# Patient Record
Sex: Female | Born: 1950 | Race: White | Hispanic: No | Marital: Married | Smoking: Never smoker
Health system: Southern US, Community
[De-identification: ages and names within clinical notes are randomized; demographics above are authoritative.]

## PROBLEM LIST (undated history)

## (undated) DIAGNOSIS — M1990 Unspecified osteoarthritis, unspecified site: Secondary | ICD-10-CM

## (undated) DIAGNOSIS — C801 Malignant (primary) neoplasm, unspecified: Secondary | ICD-10-CM

## (undated) HISTORY — PX: COLONOSCOPY: SHX174

---

## 1998-09-01 ENCOUNTER — Other Ambulatory Visit: Admission: RE | Admit: 1998-09-01 | Discharge: 1998-09-01 | Payer: Self-pay | Admitting: *Deleted

## 1999-11-08 ENCOUNTER — Other Ambulatory Visit: Admission: RE | Admit: 1999-11-08 | Discharge: 1999-11-08 | Payer: Self-pay | Admitting: Obstetrics & Gynecology

## 2000-10-03 ENCOUNTER — Encounter: Payer: Self-pay | Admitting: Obstetrics and Gynecology

## 2000-10-03 ENCOUNTER — Encounter: Admission: RE | Admit: 2000-10-03 | Discharge: 2000-10-03 | Payer: Self-pay | Admitting: Obstetrics and Gynecology

## 2000-11-10 ENCOUNTER — Other Ambulatory Visit: Admission: RE | Admit: 2000-11-10 | Discharge: 2000-11-10 | Payer: Self-pay | Admitting: *Deleted

## 2001-11-09 ENCOUNTER — Encounter: Payer: Self-pay | Admitting: *Deleted

## 2001-11-09 ENCOUNTER — Encounter: Admission: RE | Admit: 2001-11-09 | Discharge: 2001-11-09 | Payer: Self-pay | Admitting: *Deleted

## 2002-08-01 ENCOUNTER — Other Ambulatory Visit: Admission: RE | Admit: 2002-08-01 | Discharge: 2002-08-01 | Payer: Self-pay | Admitting: Obstetrics and Gynecology

## 2004-01-08 ENCOUNTER — Encounter: Admission: RE | Admit: 2004-01-08 | Discharge: 2004-01-08 | Payer: Self-pay | Admitting: Obstetrics and Gynecology

## 2004-01-26 ENCOUNTER — Other Ambulatory Visit: Admission: RE | Admit: 2004-01-26 | Discharge: 2004-01-26 | Payer: Self-pay | Admitting: Obstetrics and Gynecology

## 2005-02-24 ENCOUNTER — Ambulatory Visit: Payer: Self-pay | Admitting: Family Medicine

## 2005-07-07 ENCOUNTER — Other Ambulatory Visit: Admission: RE | Admit: 2005-07-07 | Discharge: 2005-07-07 | Payer: Self-pay | Admitting: Obstetrics and Gynecology

## 2013-03-07 ENCOUNTER — Other Ambulatory Visit: Payer: Self-pay | Admitting: Obstetrics and Gynecology

## 2013-03-07 DIAGNOSIS — R928 Other abnormal and inconclusive findings on diagnostic imaging of breast: Secondary | ICD-10-CM

## 2013-03-12 ENCOUNTER — Ambulatory Visit
Admission: RE | Admit: 2013-03-12 | Discharge: 2013-03-12 | Disposition: A | Discharge status: Home / Self Care | Payer: BC Managed Care – PPO | Source: Ambulatory Visit | Attending: Obstetrics and Gynecology | Admitting: Obstetrics and Gynecology

## 2013-03-12 DIAGNOSIS — R928 Other abnormal and inconclusive findings on diagnostic imaging of breast: Secondary | ICD-10-CM

## 2014-02-03 ENCOUNTER — Other Ambulatory Visit: Payer: Self-pay | Admitting: Obstetrics and Gynecology

## 2014-02-03 DIAGNOSIS — R921 Mammographic calcification found on diagnostic imaging of breast: Secondary | ICD-10-CM

## 2014-03-28 ENCOUNTER — Ambulatory Visit
Admission: RE | Admit: 2014-03-28 | Discharge: 2014-03-28 | Disposition: A | Discharge status: Home / Self Care | Payer: BC Managed Care – PPO | Source: Ambulatory Visit | Attending: Obstetrics and Gynecology | Admitting: Obstetrics and Gynecology

## 2014-03-28 DIAGNOSIS — R921 Mammographic calcification found on diagnostic imaging of breast: Secondary | ICD-10-CM

## 2015-10-09 ENCOUNTER — Other Ambulatory Visit: Payer: Self-pay | Admitting: Obstetrics and Gynecology

## 2015-10-09 DIAGNOSIS — R921 Mammographic calcification found on diagnostic imaging of breast: Secondary | ICD-10-CM

## 2015-10-19 ENCOUNTER — Ambulatory Visit
Admission: RE | Admit: 2015-10-19 | Discharge: 2015-10-19 | Disposition: A | Discharge status: Home / Self Care | Payer: Commercial Managed Care - HMO | Source: Ambulatory Visit | Attending: Obstetrics and Gynecology | Admitting: Obstetrics and Gynecology

## 2015-10-19 DIAGNOSIS — R921 Mammographic calcification found on diagnostic imaging of breast: Secondary | ICD-10-CM

## 2017-05-22 ENCOUNTER — Other Ambulatory Visit: Payer: Self-pay | Admitting: Obstetrics and Gynecology

## 2017-05-22 DIAGNOSIS — Z1231 Encounter for screening mammogram for malignant neoplasm of breast: Secondary | ICD-10-CM

## 2017-06-20 ENCOUNTER — Ambulatory Visit
Admission: RE | Admit: 2017-06-20 | Discharge: 2017-06-20 | Disposition: A | Discharge status: Home / Self Care | Payer: BLUE CROSS/BLUE SHIELD | Source: Ambulatory Visit | Attending: Obstetrics and Gynecology | Admitting: Obstetrics and Gynecology

## 2017-06-20 DIAGNOSIS — Z1231 Encounter for screening mammogram for malignant neoplasm of breast: Secondary | ICD-10-CM

## 2018-06-11 ENCOUNTER — Other Ambulatory Visit: Payer: Self-pay | Admitting: Obstetrics and Gynecology

## 2018-06-11 DIAGNOSIS — Z1231 Encounter for screening mammogram for malignant neoplasm of breast: Secondary | ICD-10-CM

## 2018-07-16 ENCOUNTER — Ambulatory Visit
Admission: RE | Admit: 2018-07-16 | Discharge: 2018-07-16 | Disposition: A | Discharge status: Home / Self Care | Payer: BLUE CROSS/BLUE SHIELD | Source: Ambulatory Visit | Attending: Obstetrics and Gynecology | Admitting: Obstetrics and Gynecology

## 2018-07-16 DIAGNOSIS — Z1231 Encounter for screening mammogram for malignant neoplasm of breast: Secondary | ICD-10-CM

## 2019-06-12 ENCOUNTER — Other Ambulatory Visit: Payer: Self-pay | Admitting: Obstetrics and Gynecology

## 2019-06-12 DIAGNOSIS — Z1231 Encounter for screening mammogram for malignant neoplasm of breast: Secondary | ICD-10-CM

## 2019-07-25 ENCOUNTER — Ambulatory Visit
Admission: RE | Admit: 2019-07-25 | Discharge: 2019-07-25 | Disposition: A | Discharge status: Home / Self Care | Payer: Medicare Other | Source: Ambulatory Visit | Attending: Obstetrics and Gynecology | Admitting: Obstetrics and Gynecology

## 2019-07-25 ENCOUNTER — Other Ambulatory Visit: Payer: Self-pay

## 2019-07-25 DIAGNOSIS — Z1231 Encounter for screening mammogram for malignant neoplasm of breast: Secondary | ICD-10-CM

## 2020-07-06 ENCOUNTER — Other Ambulatory Visit: Payer: Self-pay | Admitting: Obstetrics and Gynecology

## 2020-07-06 DIAGNOSIS — Z1231 Encounter for screening mammogram for malignant neoplasm of breast: Secondary | ICD-10-CM

## 2020-07-27 ENCOUNTER — Other Ambulatory Visit: Payer: Self-pay

## 2020-07-27 ENCOUNTER — Ambulatory Visit
Admission: RE | Admit: 2020-07-27 | Discharge: 2020-07-27 | Disposition: A | Discharge status: Home / Self Care | Payer: Medicare Other | Source: Ambulatory Visit | Attending: Obstetrics and Gynecology | Admitting: Obstetrics and Gynecology

## 2020-07-27 DIAGNOSIS — Z1231 Encounter for screening mammogram for malignant neoplasm of breast: Secondary | ICD-10-CM

## 2021-06-13 IMAGING — MG DIGITAL SCREENING BILAT W/ TOMO W/ CAD
8 series · 8 of 24 positions shown · non-contrast
Comparison: Previous exam(s).

CLINICAL DATA: Screening.

EXAM:
DIGITAL SCREENING BILATERAL MAMMOGRAM WITH TOMO AND CAD

[R MLO synth-2D]
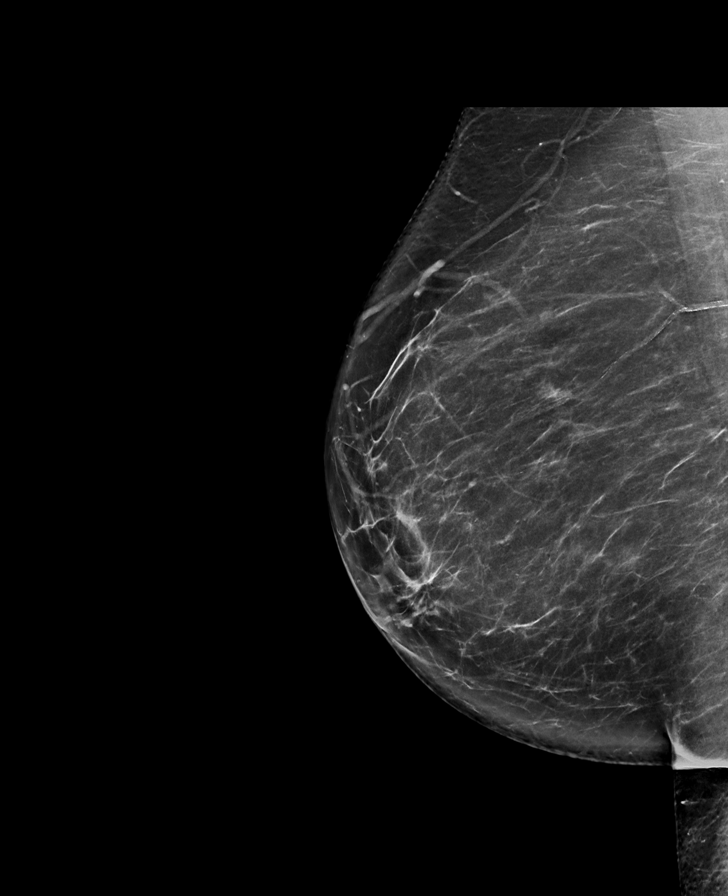

[R CC synth-2D]
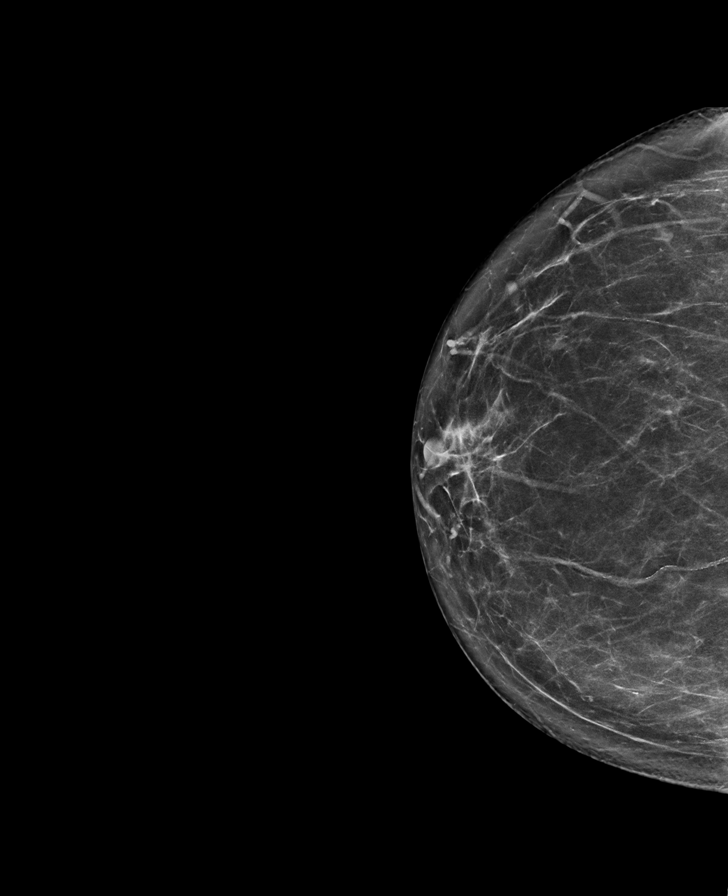

[L MLO synth-2D]
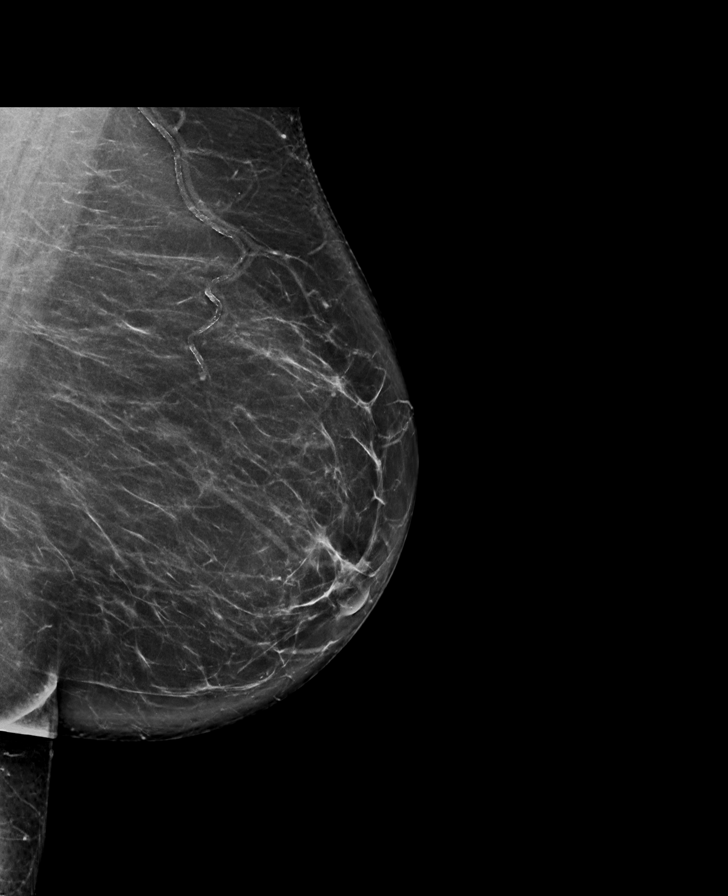

[L CC synth-2D]
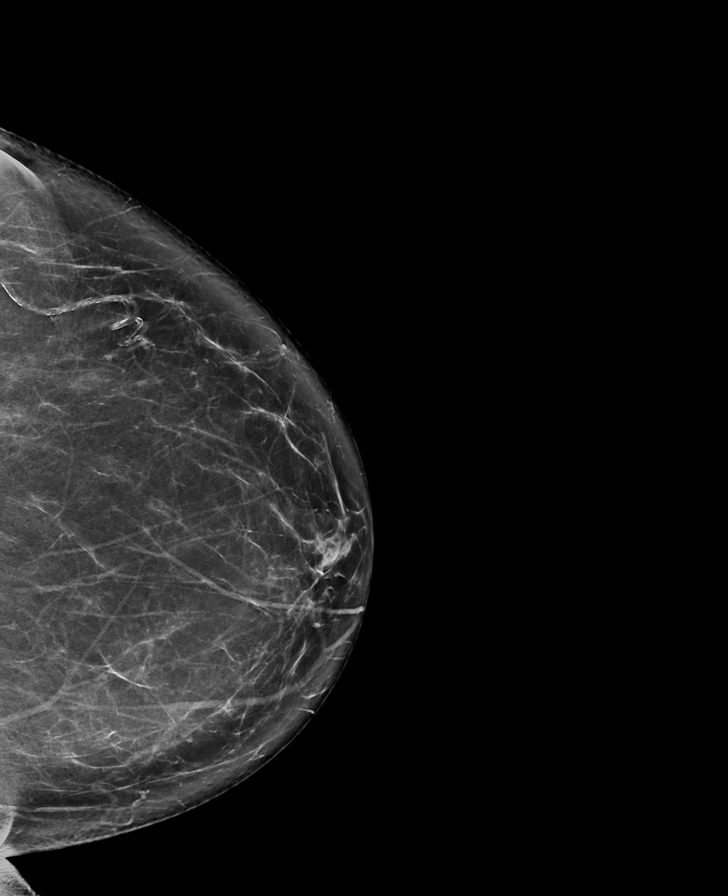

[R CC tomo · tomo slice 39/77.0]
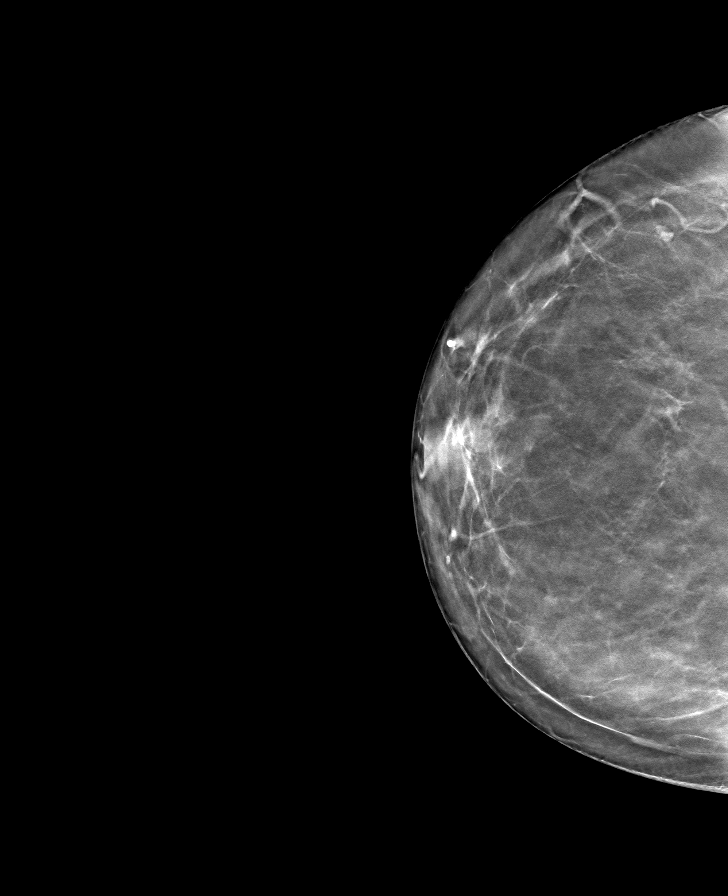

[L MLO tomo · tomo slice 43/86.0]
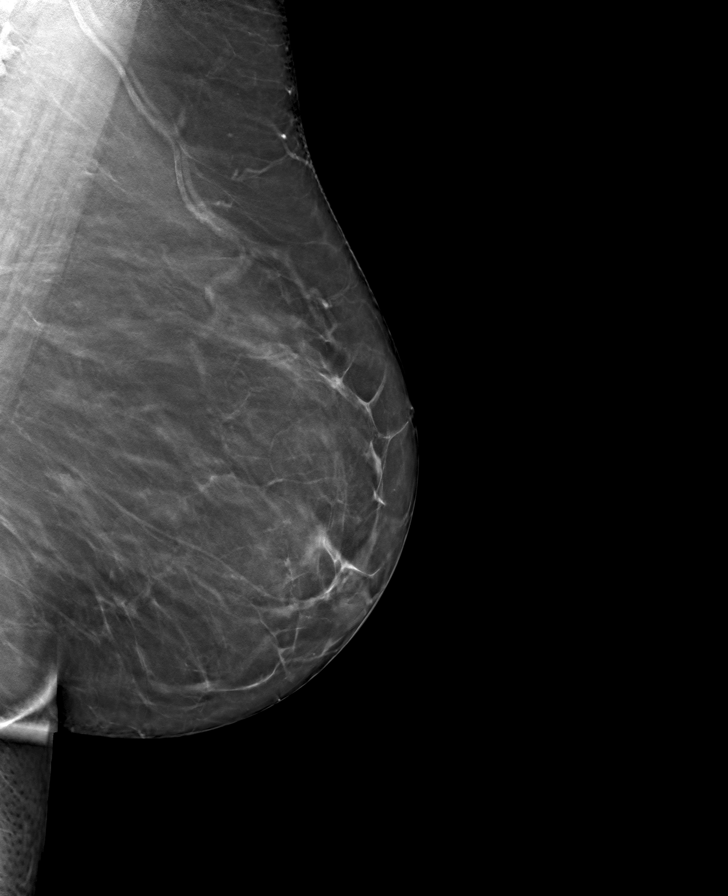

[R MLO tomo · tomo slice 43/86.0]
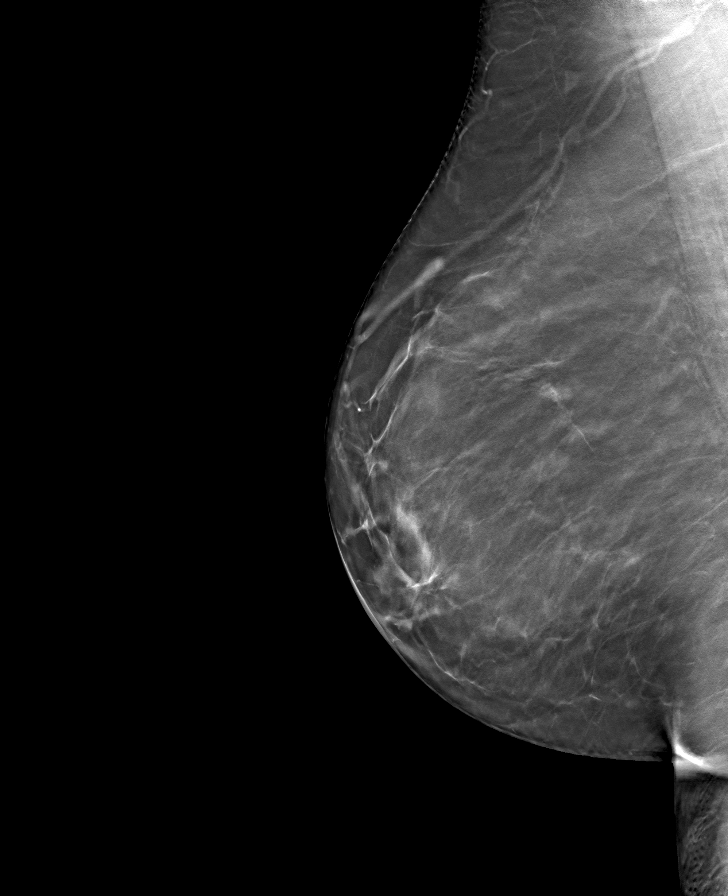

[L CC tomo · tomo slice 43/84.0]
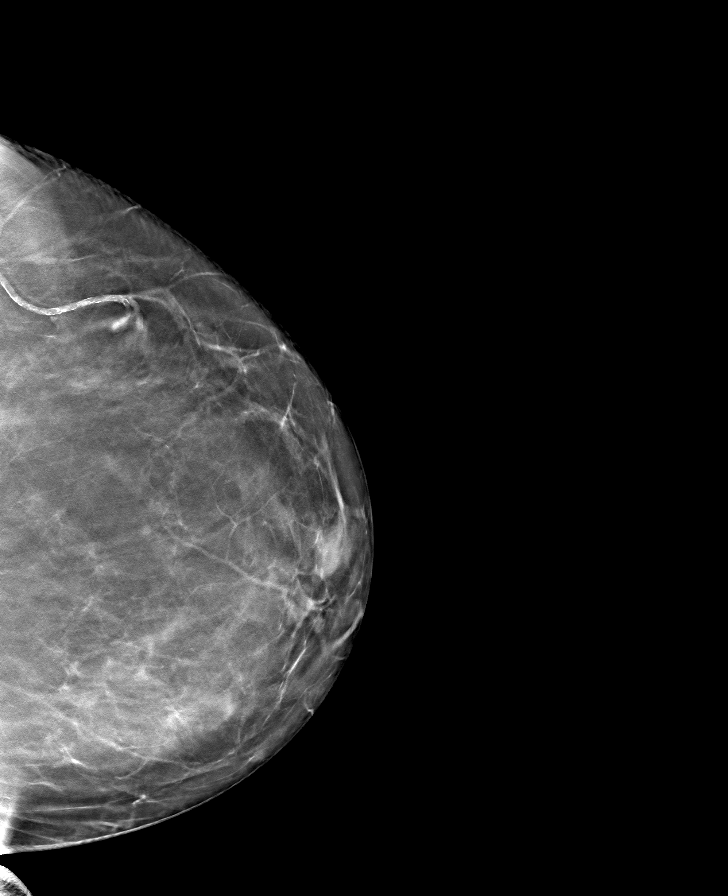

[8 of 24 positions shown; findings below may reference images not displayed]

ACR Breast Density Category b: There are scattered areas of
fibroglandular density.
FINDINGS: There are no findings suspicious for malignancy. Images were
processed with CAD.
IMPRESSION: No mammographic evidence of malignancy. A result letter of this
screening mammogram will be mailed directly to the patient.

RECOMMENDATION:
Screening mammogram in one year. (Code:[id number])

BI-RADS CATEGORY  1: Negative.

## 2021-06-22 ENCOUNTER — Other Ambulatory Visit: Payer: Self-pay | Admitting: Obstetrics and Gynecology

## 2021-06-22 DIAGNOSIS — Z1231 Encounter for screening mammogram for malignant neoplasm of breast: Secondary | ICD-10-CM

## 2021-08-19 ENCOUNTER — Ambulatory Visit
Admission: RE | Admit: 2021-08-19 | Discharge: 2021-08-19 | Disposition: A | Discharge status: Home / Self Care | Payer: Medicare Other | Source: Ambulatory Visit | Attending: Obstetrics and Gynecology | Admitting: Obstetrics and Gynecology

## 2021-08-19 ENCOUNTER — Other Ambulatory Visit: Payer: Self-pay

## 2021-08-19 DIAGNOSIS — Z1231 Encounter for screening mammogram for malignant neoplasm of breast: Secondary | ICD-10-CM

## 2022-02-02 NOTE — Patient Instructions (Signed)
DUE TO COVID-19 ONLY ONE VISITOR  (aged 69 and older)  IS ALLOWED TO COME WITH YOU AND STAY IN THE WAITING ROOM ONLY DURING PRE OP AND PROCEDURE.   **NO VISITORS ARE ALLOWED IN THE SHORT STAY AREA OR RECOVERY ROOM!!**  IF YOU WILL BE ADMITTED INTO THE HOSPITAL YOU ARE ALLOWED ONLY TWO SUPPORT PEOPLE DURING VISITATION HOURS ONLY (7 AM -8PM)   The support person(s) must pass our screening, gel in and out, and wear a mask at all times, including in the patients room. Patients must also wear a mask when staff or their support person are in the room. Visitors GUEST BADGE MUST BE WORN VISIBLY  One adult visitor may remain with you overnight and MUST be in the room by 8 P.M.        Your procedure is scheduled on: 02/15/22   Report to [facility] Main Entrance    Report to admitting at : 7:15 AM   Call this number if you have problems the morning of surgery [phone]   Do not eat food :After Midnight.   After Midnight you may have the following liquids until: 7:00 AM DAY OF SURGERY  Water Black Coffee (sugar ok, NO MILK/CREAM OR CREAMERS)  Tea (sugar ok, NO MILK/CREAM OR CREAMERS) regular and decaf                             Plain Jell-O (NO RED)                                           Fruit ices (not with fruit pulp, NO RED)                                     Popsicles (NO RED)                                                                  Juice: apple, WHITE grape, WHITE cranberry Sports drinks like Gatorade (NO RED) Clear broth(vegetable,chicken,beef)   Drink Ensure drink AT : 7:00 AM the day of surgery.   The day of surgery:  Drink ONE (1) Pre-Surgery Clear Ensure or G2 at AM the morning of surgery. Drink in one sitting. Do not sip.  This drink was given to you during your hospital  pre-op appointment visit. Nothing else to drink after completing the  Pre-Surgery Clear Ensure or G2.          If you have questions, please contact your surgeons office.    Oral  Hygiene is also important to reduce your risk of infection.                                    Remember - BRUSH YOUR TEETH THE MORNING OF SURGERY WITH YOUR REGULAR TOOTHPASTE   Do NOT smoke after Midnight   Take these medicines the morning of surgery with A SIP OF WATER: N/A  DO NOT TAKE ANY  ORAL DIABETIC MEDICATIONS DAY OF YOUR SURGERY  Bring CPAP mask and tubing day of surgery.                              You may not have any metal on your body including hair pins, jewelry, and body piercing             Do not wear make-up, lotions, powders, perfumes/cologne, or deodorant  Do not wear nail polish including gel and S&S, artificial/acrylic nails, or any other type of covering on natural nails including finger and toenails. If you have artificial nails, gel coating, etc. that needs to be removed by a nail salon please have this removed prior to surgery or surgery may need to be canceled/ delayed if the surgeon/ anesthesia feels like they are unable to be safely monitored.   Do not shave  48 hours prior to surgery.    Do not bring valuables to the hospital. [place].   Contacts, dentures or bridgework may not be worn into surgery.   Bring small overnight bag day of surgery.    Patients discharged on the day of surgery will not be allowed to drive home.  Someone NEEDS to stay with you for the first 24 hours after anesthesia.   Special Instructions: Bring a copy of your healthcare power of attorney and living will documents         the day of surgery if you haven't scanned them before.              Please read over the following fact sheets you were given: IF YOU HAVE QUESTIONS ABOUT YOUR PRE-OP INSTRUCTIONS PLEASE CALL [phone]     [facility] - Preparing for Surgery Before surgery, you can play an important role.  Because skin is not sterile, your skin needs to be as free of germs as possible.  You can reduce the number of germs on  your skin by washing with CHG (chlorahexidine gluconate) soap before surgery.  CHG is an antiseptic cleaner which kills germs and bonds with the skin to continue killing germs even after washing. Please DO NOT use if you have an allergy to CHG or antibacterial soaps.  If your skin becomes reddened/irritated stop using the CHG and inform your nurse when you arrive at Short Stay. Do not shave (including legs and underarms) for at least 48 hours prior to the first CHG shower.  You may shave your face/neck. Please follow these instructions carefully:  1.  Shower with CHG Soap the night before surgery and the  morning of Surgery.  2.  If you choose to wash your hair, wash your hair first as usual with your  normal  shampoo.  3.  After you shampoo, rinse your hair and body thoroughly to remove the  shampoo.                           4.  Use CHG as you would any other liquid soap.  You can apply chg directly  to the skin and wash                       Gently with a scrungie or clean washcloth.  5.  Apply the CHG Soap to your body ONLY  FROM THE NECK DOWN.   Do not use on face/ open                           Wound or open sores. Avoid contact with eyes, ears mouth and genitals (private parts).                       Wash face,  Genitals (private parts) with your normal soap.             6.  Wash thoroughly, paying special attention to the area where your surgery  will be performed.  7.  Thoroughly rinse your body with warm water from the neck down.  8.  DO NOT shower/wash with your normal soap after using and rinsing off  the CHG Soap.                9.  Pat yourself dry with a clean towel.            10.  Wear clean pajamas.            11.  Place clean sheets on your bed the night of your first shower and do not  sleep with pets. Day of Surgery : Do not apply any lotions/deodorants the morning of surgery.  Please wear clean clothes to the hospital/surgery center.  FAILURE TO FOLLOW THESE INSTRUCTIONS MAY  RESULT IN THE CANCELLATION OF YOUR SURGERY PATIENT SIGNATURE_________________________________  NURSE SIGNATURE__________________________________  ________________________________________________________________________   Kristina Vang  An incentive spirometer is a tool that can help keep your lungs clear and active. This tool measures how well you are filling your lungs with each breath. Taking long deep breaths may help reverse or decrease the chance of developing breathing (pulmonary) problems (especially infection) following: A long period of time when you are unable to move or be active. BEFORE THE PROCEDURE  If the spirometer includes an indicator to show your best effort, your nurse or respiratory therapist will set it to a desired goal. If possible, sit up straight or lean slightly forward. Try not to slouch. Hold the incentive spirometer in an upright position. INSTRUCTIONS FOR USE  Sit on the edge of your bed if possible, or sit up as far as you can in bed or on a chair. Hold the incentive spirometer in an upright position. Breathe out normally. Place the mouthpiece in your mouth and seal your lips tightly around it. Breathe in slowly and as deeply as possible, raising the piston or the ball toward the top of the column. Hold your breath for 3-5 seconds or for as long as possible. Allow the piston or ball to fall to the bottom of the column. Remove the mouthpiece from your mouth and breathe out normally. Rest for a few seconds and repeat Steps 1 through 7 at least 10 times every 1-2 hours when you are awake. Take your time and take a few normal breaths between deep breaths. The spirometer may include an indicator to show your best effort. Use the indicator as a goal to work toward during each repetition. After each set of 10 deep breaths, practice coughing to be sure your lungs are clear. If you have an incision (the cut made at the time of surgery), support your incision  when coughing by placing a pillow or rolled up towels firmly against it. Once you are able to get out of bed, walk around indoors and cough well.  You may stop using the incentive spirometer when instructed by your caregiver.  RISKS AND COMPLICATIONS Take your time so you do not get dizzy or light-headed. If you are in pain, you may need to take or ask for pain medication before doing incentive spirometry. It is harder to take a deep breath if you are having pain. AFTER USE Rest and breathe slowly and easily. It can be helpful to keep track of a log of your progress. Your caregiver can provide you with a simple table to help with this. If you are using the spirometer at home, follow these instructions: [place] IF:  You are having difficultly using the spirometer. You have trouble using the spirometer as often as instructed. Your pain medication is not giving enough relief while using the spirometer. You develop fever of 100.5 F (38.1 C) or higher. SEEK IMMEDIATE MEDICAL CARE IF:  You cough up bloody sputum that had not been present before. You develop fever of 102 F (38.9 C) or greater. You develop worsening pain at or near the incision site. MAKE SURE YOU:  Understand these instructions. Will watch your condition. Will get help right away if you are not doing well or get worse. Document Released: 03/27/2007 Document Revised: 02/06/2012 Document Reviewed: 05/28/2007 [facility] Patient Information 2014 [place], [place].   ________________________________________________________________________

## 2022-02-03 ENCOUNTER — Other Ambulatory Visit: Payer: Self-pay

## 2022-02-03 ENCOUNTER — Encounter (INDEPENDENT_AMBULATORY_CARE_PROVIDER_SITE_OTHER): Payer: Self-pay

## 2022-02-03 ENCOUNTER — Encounter (HOSPITAL_COMMUNITY)
Admission: RE | Admit: 2022-02-03 | Discharge: 2022-02-03 | Disposition: A | Discharge status: Home / Self Care | Payer: Medicare Other | Source: Ambulatory Visit | Attending: Orthopedic Surgery | Admitting: Orthopedic Surgery

## 2022-02-03 ENCOUNTER — Encounter (HOSPITAL_COMMUNITY): Payer: Self-pay

## 2022-02-03 VITALS — BP 156/69 | HR 57 | Temp 97.8°F | Resp 16 | Ht 61.0 in | Wt 176.0 lb

## 2022-02-03 DIAGNOSIS — Z01812 Encounter for preprocedural laboratory examination: Secondary | ICD-10-CM | POA: Insufficient documentation

## 2022-02-03 DIAGNOSIS — Z01818 Encounter for other preprocedural examination: Secondary | ICD-10-CM

## 2022-02-03 HISTORY — DX: Unspecified osteoarthritis, unspecified site: M19.90

## 2022-02-03 HISTORY — DX: Malignant (primary) neoplasm, unspecified: C80.1

## 2022-02-03 LAB — CBC
HCT: 40 % (ref 36.0–46.0)
Hemoglobin: 13.2 g/dL (ref 12.0–15.0)
MCH: 30.2 pg (ref 26.0–34.0)
MCHC: 33 g/dL (ref 30.0–36.0)
MCV: 91.5 fL (ref 80.0–100.0)
Platelets: 293 10*3/uL (ref 150–400)
RBC: 4.37 MIL/uL (ref 3.87–5.11)
RDW: 12.5 % (ref 11.5–15.5)
WBC: 5.5 10*3/uL (ref 4.0–10.5)
nRBC: 0 % (ref 0.0–0.2)

## 2022-02-03 LAB — SURGICAL PCR SCREEN
MRSA, PCR: NEGATIVE
Staphylococcus aureus: NEGATIVE

## 2022-02-03 LAB — TYPE AND SCREEN
ABO/RH(D): B POS
Antibody Screen: NEGATIVE

## 2022-02-03 NOTE — Progress Notes (Addendum)
For Short Stay: ?[place] appointment date: N/A ?Date of COVID positive in last 90 days: N/A ?COVID Vaccine:Moderna x 2: 2021 ?Bowel Prep reminder: ? ? ?For Anesthesia: ?[facility]P - [facility] ?Cardiologist - NO ? ?Chest x-ray -  ?EKG -  ?Stress Test -  ?ECHO -  ?Cardiac Cath -  ?Pacemaker/ICD device last checked: ?Pacemaker orders received: ?Device Rep notified: ? ?Spinal Cord Stimulator: ? ?Sleep Study -  ?CPAP -  ? ?Fasting Blood Sugar -  ?Checks Blood Sugar _____ times a day ?Date and result of last Hgb A1c- ? ?Blood Thinner Instructions: ?Aspirin Instructions: ?Last Dose: ? ?Activity level: Can go up a flight of stairs and activities of daily living without stopping and without chest pain and/or shortness of breath ?  Able to exercise without chest pain and/or shortness of breath ?  Unable to go up a flight of stairs without chest pain and/or shortness of breath ?   ? ?Anesthesia review:  ? ?Patient denies shortness of breath, fever, cough and chest pain at PAT appointment ? ? ?Patient verbalized understanding of instructions that were given to them at the PAT appointment. Patient was also instructed that they will need to review over the PAT instructions again at home before surgery.  ?

## 2022-02-08 NOTE — H&P (Signed)
HIP ARTHROPLASTY ADMISSION H&P ? ?Patient ID: ?Kristina Vang ?MRN: [id number] ?DOB/AGE: 12/19/1950 71 y.o. ? ?Chief Complaint: right hip pain. ? ?Planned Procedure Date: 02/15/22 ?Medical and Cardiac Clearance by Kandyce Rud PA-C   ? ? ?HPI: ?Kristina Vang is a 71 y.o. 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 The patient has a history of pain and functional disability in the right hip due to arthritis and has failed non-surgical conservative treatments for greater than 12 weeks to include NSAID's and/or analgesics, corticosteriod injections, supervised PT with diminished ADL's post treatment, and activity modification.  Onset of symptoms was gradual, starting 3 years ago with gradually worsening course since that time. The patient noted no past surgery on the right hip.  Patient currently rates pain at 10 out of 10 with activity. Patient has night pain, worsening of pain with activity and weight bearing, and pain that interferes with activities of daily living.  Patient has evidence of subchondral sclerosis, periarticular osteophytes, and joint space narrowing by imaging studies.  There is no active infection. ? ?Past Medical History:  ?Diagnosis Date  ? Arthritis   ? Cancer [facility])   ? ?Past Surgical History:  ?Procedure Laterality Date  ? COLONOSCOPY    ? ?Allergies  ?Allergen Reactions  ? Codeine Nausea And Vomiting  ? ?Prior to Admission medications   ?Medication Sig Start Date End Date Taking? Authorizing Provider  ?Cholecalciferol (VITAMIN D3) 250 MCG (10000 UT) TABS Take 20,000 Units by mouth in the morning and at bedtime.   Yes [provider]  ?Evening Primrose Oil 1000 MG CAPS Take 1,000 mg by mouth daily.   Yes [provider]  ?KRILL OIL PO Take 2 capsules by mouth in the morning and at bedtime. 1085 mg per cap   Yes [provider]  ?TURMERIC CURCUMIN PO Take 2 capsules by mouth in the morning and at bedtime. 800 mg per cap   Yes [provider]   ? ?Social History  ? ?Socioeconomic History  ? Marital status: Married  ?  Spouse name: Not on file  ? Number of children: Not on file  ? Years of education: Not on file  ? Highest education level: Not on file  ?Occupational History  ? Not on file  ?Tobacco Use  ? Smoking status: Never  ? Smokeless tobacco: Never  ?Vaping Use  ? Vaping Use: Never used  ?Substance and Sexual Activity  ? Alcohol use: Yes  ?  Comment: occas.  ? Drug use: Never  ? Sexual activity: Not on file  ?Other Topics Concern  ? Not on file  ?Social History Narrative  ? Not on file  ? ?Social Determinants of Health  ? ?Financial Resource Strain: Not on file  ?Food Insecurity: Not on file  ?Transportation Needs: Not on file  ?Physical Activity: Not on file  ?Stress: Not on file  ?Social Connections: Not on file  ? ?No family history on file. ? ?ROS: Currently denies lightheadedness, dizziness, Fever, chills, CP, SOB.   ?No personal history of DVT, PE, MI, or CVA. ?No loose teeth or dentures ?All other systems have been reviewed and were otherwise currently negative with the exception of those mentioned in the HPI and as above. ? ?Objective: ?Vitals: Ht: 5'2" Wt: 179.2 lbs Temp: 97.5 BP: 134/72 Pulse: 59 O2 99% on room air.   ?Physical Exam: ?General: Alert, NAD. Trendelenberg Gait ?HEENT: EOMI, Good Neck Extension  ?Pulm: No increased work of breathing.  Clear B/L  A/P w/o crackle or wheeze.  ?CV: RRR, No m/g/r appreciated ?GI: soft, NT, ND. BS x 4 quadrants ?Neuro: CN II-XII grossly intact without focal deficit.  Sensation intact distally ?Skin: No lesions in the area of chief complaint ?MSK/Surgical Site: + TTP. Hip pain with ROM. + [place]. - SLR. + FABER/FADIR. Decreased strength.  NVI.   ? ?Imaging Review ?Plain radiographs demonstrate severe degenerative joint disease of the right hip.  ? ?The bone quality appears to be fair for age and reported activity level. ? ?Preoperative templating of the joint replacement has been completed,  documented, and submitted to the Operating Room personnel in order to optimize intra-operative equipment management. ? ?Assessment: ?OA RIGHT HIP ?Active Problems: ?  * No active hospital problems. * ? ? ?Plan: ?Plan for Procedure(s): ?TOTAL HIP ARTHROPLASTY ANTERIOR APPROACH ? ?The patient history, physical exam, clinical judgement of the provider and imaging are consistent with end stage degenerative joint disease and total joint arthroplasty is deemed medically necessary. The treatment options including medical management, injection therapy, and arthroplasty were discussed at length. The risks and benefits of Procedure(s): ?TOTAL HIP ARTHROPLASTY ANTERIOR APPROACH were presented and reviewed.  ?The risks of nonoperative treatment, versus surgical intervention including but not limited to continued pain, aseptic loosening, stiffness, dislocation/subluxation, infection, bleeding, nerve injury, blood clots, cardiopulmonary complications, morbidity, mortality, among others were discussed. The patient verbalizes understanding and wishes to proceed with the plan.  ?Patient is being admitted for surgery, pain control, PT, prophylactic antibiotics, VTE prophylaxis, progressive ambulation, ADL's and discharge planning.  ? ?Dental prophylaxis discussed and recommended for 2 years postoperatively. ? ?The patient does meet the criteria for TXA which will be used perioperatively.   ?ASA 81 mg BID will be used postoperatively for DVT prophylaxis in addition to SCDs, and early ambulation. ?Plan for high dose Tramadol, Mobic, Tylenol for pain.   ?Robaxin for muscle spasm.  ?Zofran for nausea and vomiting. ?Omeprazole for gastric protection. ?Colace for constipation ?Pharmacy- [facility] on [address] ?The patient is planning to be discharged home with OPPT and into the care of her husband Francee Piccolo who can be reached at [phone] ?Follow up appt 03/02/22 at 4:15pm ? ? ? ? ?Britt Bottom, PA-C ?Office  [phone] ?02/08/2022 ?3:31 PM ?  ?

## 2022-02-15 ENCOUNTER — Ambulatory Visit (HOSPITAL_COMMUNITY)
Admission: RE | Admit: 2022-02-15 | Discharge: 2022-02-15 | Disposition: A | Discharge status: Home / Self Care | Payer: Medicare Other | Attending: Orthopedic Surgery | Admitting: Orthopedic Surgery

## 2022-02-15 ENCOUNTER — Ambulatory Visit (HOSPITAL_COMMUNITY): Payer: Medicare Other

## 2022-02-15 ENCOUNTER — Encounter (HOSPITAL_COMMUNITY): Payer: Self-pay | Admitting: Orthopedic Surgery

## 2022-02-15 ENCOUNTER — Ambulatory Visit (HOSPITAL_BASED_OUTPATIENT_CLINIC_OR_DEPARTMENT_OTHER): Payer: Medicare Other | Admitting: Certified Registered Nurse Anesthetist

## 2022-02-15 ENCOUNTER — Ambulatory Visit (HOSPITAL_COMMUNITY): Payer: Medicare Other | Admitting: Certified Registered Nurse Anesthetist

## 2022-02-15 ENCOUNTER — Encounter (HOSPITAL_COMMUNITY)
Admission: RE | Disposition: A | Discharge status: Home / Self Care | Payer: Self-pay | Source: Home / Self Care | Attending: Orthopedic Surgery

## 2022-02-15 ENCOUNTER — Other Ambulatory Visit: Payer: Self-pay

## 2022-02-15 DIAGNOSIS — M1611 Unilateral primary osteoarthritis, right hip: Secondary | ICD-10-CM | POA: Insufficient documentation

## 2022-02-15 DIAGNOSIS — E669 Obesity, unspecified: Secondary | ICD-10-CM | POA: Insufficient documentation

## 2022-02-15 DIAGNOSIS — R262 Difficulty in walking, not elsewhere classified: Secondary | ICD-10-CM | POA: Diagnosis not present

## 2022-02-15 DIAGNOSIS — M6281 Muscle weakness (generalized): Secondary | ICD-10-CM | POA: Insufficient documentation

## 2022-02-15 DIAGNOSIS — Z96641 Presence of right artificial hip joint: Secondary | ICD-10-CM

## 2022-02-15 DIAGNOSIS — Z6833 Body mass index (BMI) 33.0-33.9, adult: Secondary | ICD-10-CM | POA: Insufficient documentation

## 2022-02-15 HISTORY — PX: TOTAL HIP ARTHROPLASTY: SHX124

## 2022-02-15 LAB — ABO/RH: ABO/RH(D): B POS

## 2022-02-15 SURGERY — ARTHROPLASTY, HIP, TOTAL, ANTERIOR APPROACH
Anesthesia: Spinal | Site: Hip | Laterality: Right

## 2022-02-15 MED ORDER — LACTATED RINGERS IV SOLN
INTRAVENOUS | Status: DC
Start: 2022-02-15 — End: 2022-02-15

## 2022-02-15 MED ORDER — BUPIVACAINE LIPOSOME 1.3 % IJ SUSP
INTRAMUSCULAR | Status: DC | PRN
Start: 2022-02-15 — End: 2022-02-15
  Administered 2022-02-15: 10 mL

## 2022-02-15 MED ORDER — 0.9 % SODIUM CHLORIDE (POUR BTL) OPTIME
TOPICAL | Status: DC | PRN
  Administered 2022-02-15: 1000 mL

## 2022-02-15 MED ORDER — METHOCARBAMOL 500 MG IVPB - SIMPLE MED
500.0000 mg | Freq: Four times a day (QID) | INTRAVENOUS | Status: DC | PRN
  Administered 2022-02-15: 500 mg via INTRAVENOUS

## 2022-02-15 MED ORDER — ONDANSETRON 4 MG PO TBDP: 4.0000 mg | ORAL_TABLET | Freq: Two times a day (BID) | ORAL | 0 refills | Status: AC | PRN

## 2022-02-15 MED ORDER — TRAMADOL HCL 50 MG PO TABS
50.0000 mg | ORAL_TABLET | Freq: Four times a day (QID) | ORAL | 0 refills | Status: AC | PRN
Start: 2022-02-15 — End: ?

## 2022-02-15 MED ORDER — PROPOFOL 1000 MG/100ML IV EMUL
INTRAVENOUS | Status: AC
  Filled 2022-02-15: qty 100

## 2022-02-15 MED ORDER — POVIDONE-IODINE 10 % EX SWAB
2.0000 "application " | Freq: Once | CUTANEOUS | Status: AC
  Administered 2022-02-15: 2 via TOPICAL

## 2022-02-15 MED ORDER — ONDANSETRON HCL 4 MG/2ML IJ SOLN
INTRAMUSCULAR | Status: DC | PRN
  Administered 2022-02-15: 4 mg via INTRAVENOUS

## 2022-02-15 MED ORDER — MEPERIDINE HCL 50 MG/ML IJ SOLN: 6.2500 mg | INTRAMUSCULAR | Status: DC | PRN

## 2022-02-15 MED ORDER — ORAL CARE MOUTH RINSE: 15.0000 mL | Freq: Once | OROMUCOSAL | Status: AC

## 2022-02-15 MED ORDER — ASPIRIN EC 81 MG PO TBEC: 81.0000 mg | DELAYED_RELEASE_TABLET | Freq: Two times a day (BID) | ORAL | 0 refills | Status: AC

## 2022-02-15 MED ORDER — HYDROCODONE-ACETAMINOPHEN 7.5-325 MG PO TABS
1.0000 | ORAL_TABLET | ORAL | Status: DC | PRN
  Administered 2022-02-15: 2 via ORAL

## 2022-02-15 MED ORDER — ONDANSETRON HCL 4 MG/2ML IJ SOLN
4.0000 mg | Freq: Four times a day (QID) | INTRAMUSCULAR | Status: DC | PRN
  Administered 2022-02-15: 4 mg via INTRAVENOUS

## 2022-02-15 MED ORDER — FENTANYL CITRATE (PF) 100 MCG/2ML IJ SOLN
INTRAMUSCULAR | Status: AC
  Filled 2022-02-15: qty 2

## 2022-02-15 MED ORDER — METHOCARBAMOL 500 MG PO TABS: 500.0000 mg | ORAL_TABLET | Freq: Four times a day (QID) | ORAL | Status: DC | PRN

## 2022-02-15 MED ORDER — METHOCARBAMOL 500 MG IVPB - SIMPLE MED
INTRAVENOUS | Status: AC
  Filled 2022-02-15: qty 50

## 2022-02-15 MED ORDER — HYDROMORPHONE HCL 1 MG/ML IJ SOLN
0.2500 mg | INTRAMUSCULAR | Status: DC | PRN
  Administered 2022-02-15 (×2): 0.5 mg via INTRAVENOUS

## 2022-02-15 MED ORDER — METOCLOPRAMIDE HCL 5 MG/ML IJ SOLN: 5.0000 mg | Freq: Three times a day (TID) | INTRAMUSCULAR | Status: DC | PRN

## 2022-02-15 MED ORDER — HYDROMORPHONE HCL 1 MG/ML IJ SOLN
INTRAMUSCULAR | Status: AC
  Filled 2022-02-15: qty 1

## 2022-02-15 MED ORDER — PROPOFOL 500 MG/50ML IV EMUL
INTRAVENOUS | Status: DC | PRN
  Administered 2022-02-15: 100 ug/kg/min via INTRAVENOUS

## 2022-02-15 MED ORDER — WATER FOR IRRIGATION, STERILE IR SOLN
Status: DC | PRN
  Administered 2022-02-15: 2000 mL

## 2022-02-15 MED ORDER — LACTATED RINGERS IV BOLUS
500.0000 mL | Freq: Once | INTRAVENOUS | Status: AC
  Administered 2022-02-15: 500 mL via INTRAVENOUS

## 2022-02-15 MED ORDER — OXYCODONE HCL 5 MG PO TABS
ORAL_TABLET | ORAL | Status: AC
  Filled 2022-02-15: qty 1

## 2022-02-15 MED ORDER — TRANEXAMIC ACID-NACL 1000-0.7 MG/100ML-% IV SOLN
1000.0000 mg | INTRAVENOUS | Status: AC
  Administered 2022-02-15: 1000 mg via INTRAVENOUS
  Filled 2022-02-15: qty 100

## 2022-02-15 MED ORDER — METOCLOPRAMIDE HCL 5 MG PO TABS
5.0000 mg | ORAL_TABLET | Freq: Three times a day (TID) | ORAL | Status: DC | PRN
  Filled 2022-02-15: qty 2

## 2022-02-15 MED ORDER — ONDANSETRON HCL 4 MG PO TABS
4.0000 mg | ORAL_TABLET | Freq: Four times a day (QID) | ORAL | Status: DC | PRN
  Filled 2022-02-15: qty 1

## 2022-02-15 MED ORDER — PROPOFOL 10 MG/ML IV BOLUS
INTRAVENOUS | Status: DC | PRN
  Administered 2022-02-15: 30 mg via INTRAVENOUS

## 2022-02-15 MED ORDER — HYDROCODONE-ACETAMINOPHEN 7.5-325 MG PO TABS
ORAL_TABLET | ORAL | Status: AC
  Filled 2022-02-15: qty 2

## 2022-02-15 MED ORDER — MELOXICAM 15 MG PO TABS: 15.0000 mg | ORAL_TABLET | Freq: Every day | ORAL | 0 refills | Status: AC | PRN

## 2022-02-15 MED ORDER — TRANEXAMIC ACID-NACL 1000-0.7 MG/100ML-% IV SOLN
1000.0000 mg | Freq: Once | INTRAVENOUS | Status: AC
  Administered 2022-02-15: 1000 mg via INTRAVENOUS

## 2022-02-15 MED ORDER — CHLORHEXIDINE GLUCONATE 0.12 % MT SOLN
15.0000 mL | Freq: Once | OROMUCOSAL | Status: AC
  Administered 2022-02-15: 15 mL via OROMUCOSAL

## 2022-02-15 MED ORDER — LACTATED RINGERS IV BOLUS
250.0000 mL | Freq: Once | INTRAVENOUS | Status: AC
  Administered 2022-02-15: 250 mL via INTRAVENOUS

## 2022-02-15 MED ORDER — BUPIVACAINE IN DEXTROSE 0.75-8.25 % IT SOLN
INTRATHECAL | Status: DC | PRN
  Administered 2022-02-15: 12 mg via INTRATHECAL

## 2022-02-15 MED ORDER — DEXAMETHASONE SODIUM PHOSPHATE 10 MG/ML IJ SOLN
INTRAMUSCULAR | Status: AC
  Filled 2022-02-15: qty 1

## 2022-02-15 MED ORDER — BUPIVACAINE LIPOSOME 1.3 % IJ SUSP
INTRAMUSCULAR | Status: AC
  Filled 2022-02-15: qty 10

## 2022-02-15 MED ORDER — MORPHINE SULFATE (PF) 2 MG/ML IV SOLN: 0.5000 mg | INTRAVENOUS | Status: DC | PRN

## 2022-02-15 MED ORDER — OXYCODONE HCL 5 MG/5ML PO SOLN: 5.0000 mg | Freq: Once | ORAL | Status: AC | PRN

## 2022-02-15 MED ORDER — ONDANSETRON HCL 4 MG/2ML IJ SOLN
INTRAMUSCULAR | Status: AC
  Filled 2022-02-15: qty 2

## 2022-02-15 MED ORDER — TRAMADOL HCL 50 MG PO TABS: 50.0000 mg | ORAL_TABLET | Freq: Four times a day (QID) | ORAL | Status: DC

## 2022-02-15 MED ORDER — LIDOCAINE 2% (20 MG/ML) 5 ML SYRINGE
INTRAMUSCULAR | Status: DC | PRN
  Administered 2022-02-15: 40 mg via INTRAVENOUS

## 2022-02-15 MED ORDER — OMEPRAZOLE MAGNESIUM 20 MG PO TBEC
20.0000 mg | DELAYED_RELEASE_TABLET | Freq: Every day | ORAL | 0 refills | Status: AC
Start: 2022-02-15 — End: 2022-03-17

## 2022-02-15 MED ORDER — POVIDONE-IODINE 10 % EX SWAB: 2.0000 "application " | Freq: Once | CUTANEOUS | Status: DC

## 2022-02-15 MED ORDER — FENTANYL CITRATE (PF) 100 MCG/2ML IJ SOLN
INTRAMUSCULAR | Status: DC | PRN
  Administered 2022-02-15 (×2): 50 ug via INTRAVENOUS

## 2022-02-15 MED ORDER — SODIUM CHLORIDE (PF) 0.9 % IJ SOLN
INTRAMUSCULAR | Status: AC
  Filled 2022-02-15: qty 10

## 2022-02-15 MED ORDER — PHENYLEPHRINE HCL-NACL 20-0.9 MG/250ML-% IV SOLN
INTRAVENOUS | Status: DC | PRN
  Administered 2022-02-15: 30 ug/min via INTRAVENOUS

## 2022-02-15 MED ORDER — MIDAZOLAM HCL 2 MG/2ML IJ SOLN: 0.5000 mg | Freq: Once | INTRAMUSCULAR | Status: DC | PRN

## 2022-02-15 MED ORDER — OXYCODONE HCL 5 MG PO TABS
5.0000 mg | ORAL_TABLET | Freq: Once | ORAL | Status: AC | PRN
  Administered 2022-02-15: 5 mg via ORAL

## 2022-02-15 MED ORDER — SODIUM CHLORIDE FLUSH 0.9 % IV SOLN
INTRAVENOUS | Status: DC | PRN
  Administered 2022-02-15: 10 mL via INTRAVENOUS

## 2022-02-15 MED ORDER — METHOCARBAMOL 750 MG PO TABS: 750.0000 mg | ORAL_TABLET | Freq: Three times a day (TID) | ORAL | 0 refills | Status: AC | PRN

## 2022-02-15 MED ORDER — TRANEXAMIC ACID-NACL 1000-0.7 MG/100ML-% IV SOLN
INTRAVENOUS | Status: AC
  Filled 2022-02-15: qty 100

## 2022-02-15 MED ORDER — PROPOFOL 500 MG/50ML IV EMUL
INTRAVENOUS | Status: AC
  Filled 2022-02-15: qty 50

## 2022-02-15 MED ORDER — CEFAZOLIN SODIUM-DEXTROSE 2-4 GM/100ML-% IV SOLN
INTRAVENOUS | Status: AC
  Filled 2022-02-15: qty 100

## 2022-02-15 MED ORDER — BUPIVACAINE LIPOSOME 1.3 % IJ SUSP: 10.0000 mL | Freq: Once | INTRAMUSCULAR | Status: DC

## 2022-02-15 MED ORDER — CEFAZOLIN SODIUM-DEXTROSE 2-4 GM/100ML-% IV SOLN
2.0000 g | Freq: Four times a day (QID) | INTRAVENOUS | Status: DC
  Administered 2022-02-15: 2 g via INTRAVENOUS

## 2022-02-15 MED ORDER — HYDROCODONE-ACETAMINOPHEN 5-325 MG PO TABS: 1.0000 | ORAL_TABLET | ORAL | Status: DC | PRN

## 2022-02-15 MED ORDER — DEXAMETHASONE SODIUM PHOSPHATE 10 MG/ML IJ SOLN
8.0000 mg | Freq: Once | INTRAMUSCULAR | Status: AC
  Administered 2022-02-15: 8 mg via INTRAVENOUS

## 2022-02-15 MED ORDER — CEFAZOLIN SODIUM-DEXTROSE 2-4 GM/100ML-% IV SOLN
2.0000 g | INTRAVENOUS | Status: AC
  Administered 2022-02-15: 2 g via INTRAVENOUS
  Filled 2022-02-15: qty 100

## 2022-02-15 MED ORDER — ACETAMINOPHEN 500 MG PO TABS
1000.0000 mg | ORAL_TABLET | Freq: Once | ORAL | Status: AC
  Administered 2022-02-15: 1000 mg via ORAL
  Filled 2022-02-15: qty 2

## 2022-02-15 MED ORDER — ACETAMINOPHEN 500 MG PO TABS: 1000.0000 mg | ORAL_TABLET | Freq: Four times a day (QID) | ORAL | 0 refills | Status: AC | PRN

## 2022-02-15 SURGICAL SUPPLY — 48 items
APL PRP STRL LF DISP 70% ISPRP (MISCELLANEOUS) ×1
BAG COUNTER SPONGE SURGICOUNT (BAG) IMPLANT
BAG SPEC THK2 15X12 ZIP CLS (MISCELLANEOUS)
BAG SPNG CNTER NS LX DISP (BAG)
BAG ZIPLOCK 12X15 (MISCELLANEOUS) IMPLANT
BLADE SAG 18X100X1.27 (BLADE) ×2 IMPLANT
BLADE SURG SZ10 CARB STEEL (BLADE) ×2 IMPLANT
CHLORAPREP W/TINT 26 (MISCELLANEOUS) ×2 IMPLANT
CLSR STERI-STRIP ANTIMIC 1/2X4 (GAUZE/BANDAGES/DRESSINGS) ×2 IMPLANT
COVER PERINEAL POST (MISCELLANEOUS) ×2 IMPLANT
COVER SURGICAL LIGHT HANDLE (MISCELLANEOUS) ×2 IMPLANT
DRAPE IMP U-DRAPE 54X76 (DRAPES) ×2 IMPLANT
DRAPE STERI IOBAN 125X83 (DRAPES) ×2 IMPLANT
DRAPE U-SHAPE 47X51 STRL (DRAPES) ×4 IMPLANT
DRSG MEPILEX BORDER 4X8 (GAUZE/BANDAGES/DRESSINGS) ×2 IMPLANT
ELECT REM PT RETURN 15FT ADLT (MISCELLANEOUS) ×2 IMPLANT
GLOVE SRG 8 PF TXTR STRL LF DI (GLOVE) ×1 IMPLANT
GLOVE SURG ENC MOIS LTX SZ7.5 (GLOVE) ×2 IMPLANT
GLOVE SURG POLYISO LF SZ7.5 (GLOVE) ×2 IMPLANT
GLOVE SURG SYN 7.5  E (GLOVE) ×2
GLOVE SURG SYN 7.5 E (GLOVE) ×1 IMPLANT
GLOVE SURG SYN 7.5 PF PI (GLOVE) ×1 IMPLANT
GLOVE SURG UNDER POLY LF SZ7.5 (GLOVE) ×2 IMPLANT
GLOVE SURG UNDER POLY LF SZ8 (GLOVE) ×2
GOWN STRL REUS W/ TWL LRG LVL3 (GOWN DISPOSABLE) ×1 IMPLANT
GOWN STRL REUS W/TWL LRG LVL3 (GOWN DISPOSABLE) ×2
HEAD BIOLOX HIP 36/-5 (Joint) IMPLANT
HIP BIOLOX HD 36/-5 (Joint) ×2 IMPLANT
HOLDER FOLEY CATH W/STRAP (MISCELLANEOUS) IMPLANT
INSERT TRIDENT POLY 36 0DEG (Insert) ×1 IMPLANT
KIT TURNOVER KIT A (KITS) IMPLANT
MANIFOLD NEPTUNE II (INSTRUMENTS) ×2 IMPLANT
NS IRRIG 1000ML POUR BTL (IV SOLUTION) ×2 IMPLANT
PACK ANTERIOR HIP CUSTOM (KITS) ×2 IMPLANT
PROTECTOR NERVE ULNAR (MISCELLANEOUS) ×2 IMPLANT
SCREW HEX LP 6.5X20 (Screw) ×1 IMPLANT
SHELL CLUSTERHOLE ACETABULAR 5 (Shell) ×1 IMPLANT
SPIKE FLUID TRANSFER (MISCELLANEOUS) ×4 IMPLANT
SPONGE T-LAP 18X18 ~~LOC~~+RFID (SPONGE) ×6 IMPLANT
STEM HIP 127 DEG (Stem) ×1 IMPLANT
SUT MNCRL AB 3-0 PS2 18 (SUTURE) ×2 IMPLANT
SUT VIC AB 0 CT1 36 (SUTURE) ×2 IMPLANT
SUT VIC AB 1 CT1 36 (SUTURE) ×2 IMPLANT
SUT VIC AB 2-0 CT1 27 (SUTURE) ×4
SUT VIC AB 2-0 CT1 TAPERPNT 27 (SUTURE) ×2 IMPLANT
TRAY FOLEY MTR SLVR 16FR STAT (SET/KITS/TRAYS/PACK) IMPLANT
TUBE SUCTION HIGH CAP CLEAR NV (SUCTIONS) ×2 IMPLANT
WATER STERILE IRR 1000ML POUR (IV SOLUTION) ×4 IMPLANT

## 2022-02-15 NOTE — Anesthesia Preprocedure Evaluation (Addendum)
Anesthesia Evaluation  ?Patient identified by MRN, date of birth, ID band ?Patient awake ? ? ? ?Reviewed: ?Allergy & Precautions, NPO status , Patient's Chart, lab work & pertinent test results ? ?History of Anesthesia Complications ?Negative for: history of anesthetic complications ? ?Airway ?Mallampati: II ? ?TM Distance: >3 FB ?Neck ROM: Full ? ? ? Dental ? ?(+) Dental Advisory Given ?  ?Pulmonary ?neg pulmonary ROS,  ?  ?breath sounds clear to auscultation ? ? ? ? ? ? Cardiovascular ?negative cardio ROS ? ? ?Rhythm:Regular Rate:Normal ? ? ?  ?Neuro/Psych ?negative neurological ROS ?   ? GI/Hepatic ?negative GI ROS, Neg liver ROS,   ?Endo/Other  ?obese ? Renal/GU ?negative Renal ROS  ? ?  ?Musculoskeletal ? ?(+) Arthritis , Osteoarthritis,   ? Abdominal ?(+) + obese,   ?Peds ? Hematology ?negative hematology ROS ?(+)   ?Anesthesia Other Findings ? ? Reproductive/Obstetrics ? ?  ? ? ? ? ? ? ? ? ? ? ? ? ? ?  ?  ? ? ? ? ? ? ? ?Anesthesia Physical ?Anesthesia Plan ? ?ASA: 2 ? ?Anesthesia Plan: Spinal  ? ?Post-op Pain Management: Tylenol PO (pre-op)*  ? ?Induction:  ? ?PONV Risk Score and Plan: 2 and Ondansetron and Treatment may vary due to age or medical condition ? ?Airway Management Planned: Natural Airway and Simple Face Mask ? ?Additional Equipment: None ? ?Intra-op Plan:  ? ?Post-operative Plan:  ? ?Informed Consent: I have reviewed the patients History and Physical, chart, labs and discussed the procedure including the risks, benefits and alternatives for the proposed anesthesia with the patient or authorized representative who has indicated his/her understanding and acceptance.  ? ? ? ?Dental advisory given ? ?Plan Discussed with: CRNA and Surgeon ? ?Anesthesia Plan Comments: (Plan routine monitors, SAB )  ? ? ? ? ? ?Anesthesia Quick Evaluation ? ?

## 2022-02-15 NOTE — Anesthesia Procedure Notes (Signed)
Procedure Name: [place] ?Date/Time: 02/15/2022 10:31 AM ?Performed by: Claudia Desanctis, CRNA ?Pre-anesthesia Checklist: Patient identified, Emergency Drugs available, Suction available and Patient being monitored ?Patient Re-evaluated:Patient Re-evaluated prior to induction ?Oxygen Delivery Method: Simple face mask ? ? ? ? ?

## 2022-02-15 NOTE — Evaluation (Signed)
Physical Therapy Evaluation ?Patient Details ?Name: Kristina Vang ?MRN: [id number] ?DOB: December 16, 1950 ?Today's Date: 02/15/2022 ? ?History of Present Illness ? Pt is a 71 y.o. female presenting s/p R THA on 02/15/2022. PMH: OA.  ?Clinical Impression ? Corri Delapaz Mahr is a 72 y.o. female POD 0 s/p Rt THA. Patient reports independence with mobility at baseline. Patient is now limited by functional impairments (see PT problem list below) and requires min guard/supervision for transfers and gait with RW. At start of session pt experienced bout of emesis but symptoms of nausea resolved after and pt was able to progress throughout session with VSS. Patient was able to ambulate ~100 feet with RW and min guard and cues for safe walker management. Patient instructed in exercises to facilitate ROM and circulation. Patient will benefit from continued skilled PT interventions to address impairments and progress towards PLOF. Patient has met mobility goals at adequate level for discharge home; will continue to follow if pt continues acute stay to progress towards Mod I goals.    ?   ? ?Recommendations for follow up therapy are one component of a multi-disciplinary discharge planning process, led by the attending physician.  Recommendations may be updated based on patient status, additional functional criteria and insurance authorization. ? ?Follow Up Recommendations Follow physician's recommendations for discharge plan and follow up therapies ? ?  ?Assistance Recommended at Discharge Frequent or constant Supervision/Assistance  ?Patient can return home with the following ? A little help with walking and/or transfers;A little help with bathing/dressing/bathroom;Assist for transportation;Help with stairs or ramp for entrance;Assistance with cooking/housework ? ?  ?Equipment Recommendations None recommended by PT  ?Recommendations for Other Services ?    ?  ?Functional Status Assessment Patient has had a recent decline in their  functional status and demonstrates the ability to make significant improvements in function in a reasonable and predictable amount of time.  ? ?  ?Precautions / Restrictions Precautions ?Precautions: Fall ?Restrictions ?Weight Bearing Restrictions: No ?Other Position/Activity Restrictions: WBAT  ? ?  ? ?Mobility ? Bed Mobility ?Overal bed mobility: Needs Assistance ?Bed Mobility: Supine to Sit ?  ?  ?Supine to sit: Min guard, Supervision ?  ?  ?General bed mobility comments: guard/supervision to move to EOB. pt c/o dizziness and nausea at EOB. BP stable. pt experienced bout of emesis and reported feeling better. continued to mobilize. ?  ? ?Transfers ?Overall transfer level: Needs assistance ?Equipment used: Rolling walker (2 wheels) ?Transfers: Sit to/from Stand ?Sit to Stand: Min guard, Supervision ?  ?  ?  ?  ?  ?General transfer comment: guard/supervision to rise from EOB and toilet. pt steady once standing. ?  ? ?Ambulation/Gait ?Ambulation/Gait assistance: Min guard, Supervision ?Gait Distance (Feet): 100 Feet ?Assistive device: Rolling walker (2 wheels) ?Gait Pattern/deviations: Step-to pattern, Decreased stride length ?Gait velocity: decr ?  ?  ?General Gait Details: cues for step pattern and proximity to RW, no overt LOB noted. pt with slow, short cautious steps at start and step length improved as pt continued. ? ?Stairs ?  ?  ?  ?  ?  ? ?Wheelchair Mobility ?  ? ?Modified Rankin (Stroke Patients Only) ?  ? ?  ? ?Balance Overall balance assessment: Needs assistance ?Sitting-balance support: Feet supported ?Sitting balance-Leahy Scale: Good ?  ?  ?Standing balance support: Reliant on assistive device for balance, During functional activity, Bilateral upper extremity supported ?Standing balance-Leahy Scale: Poor ?  ?  ?  ?  ?  ?  ?  ?  ?  ?  ?  ?  ?   ? ? ? ?  Pertinent Vitals/Pain Pain Assessment ?Pain Assessment: 0-10 ?Pain Score: 4  ?Pain Location: Rt hip and knee ?Pain Descriptors / Indicators: Aching,  Discomfort ?Pain Intervention(s): Limited activity within patient's tolerance, Monitored during session, Repositioned  ? ? ?Home Living Family/patient expects to be discharged to:: Private residence ?Living Arrangements: Spouse/significant other ?Available Help at Discharge: Family ?Type of Home: House ?Home Access: Stairs to enter;Level entry ?Entrance Stairs-Rails: None ?Entrance Stairs-Number of Steps: 1 ?  ?Home Layout: One level ?Home Equipment: Conservation officer, nature (2 wheels);Shower seat ?   ?  ?Prior Function Prior Level of Function : Independent/Modified Independent ?  ?  ?  ?  ?  ?  ?  ?  ?  ? ? ?Hand Dominance  ? Dominant Hand: Right ? ?  ?Extremity/Trunk Assessment  ? Upper Extremity Assessment ?Upper Extremity Assessment: Overall WFL for tasks assessed ?  ? ?Lower Extremity Assessment ?Lower Extremity Assessment: Overall WFL for tasks assessed ?  ? ?Cervical / Trunk Assessment ?Cervical / Trunk Assessment: Normal  ?Communication  ? Communication: No difficulties  ?Cognition Arousal/Alertness: Awake/alert ?Behavior During Therapy: [facility] for tasks assessed/performed ?Overall Cognitive Status: Within Functional Limits for tasks assessed ?  ?  ?  ?  ?  ?  ?  ?  ?  ?  ?  ?  ?  ?  ?  ?  ?  ?  ?  ? ?  ?General Comments   ? ?  ?Exercises Total Joint Exercises ?Ankle Circles/Pumps: AROM, Both, 10 reps, Seated ?Quad Sets: AROM, Right, 5 reps, Seated ?Short Arc Quad: AROM, Right, 5 reps, Seated, PROM ?Heel Slides: AROM, Right, 5 reps, Seated ?Hip ABduction/ADduction: Right, 5 reps, Seated, AAROM  ? ?Assessment/Plan  ?  ?PT Assessment Patient needs continued PT services  ?PT Problem List Decreased range of motion;Decreased strength;Decreased activity tolerance;Decreased balance;Decreased mobility;Decreased knowledge of use of DME;Decreased knowledge of precautions;Pain ? ?   ?  ?PT Treatment Interventions DME instruction;Gait training;Stair training;Functional mobility training;Therapeutic activities;Therapeutic  exercise;Balance training;Neuromuscular re-education;Patient/family education   ? ?PT Goals (Current goals can be found in the Care Plan section)  ?Acute Rehab PT Goals ?Patient Stated Goal: get home, stop being nauseous ?PT Goal Formulation: With patient ?Time For Goal Achievement: 02/22/22 ?Potential to Achieve Goals: Good ? ?  ?Frequency 7X/week ?  ? ? ?Co-evaluation   ?  ?  ?  ?  ? ? ?  ?AM-PAC PT "6 Clicks" Mobility  ?Outcome Measure Help needed turning from your back to your side while in a flat bed without using bedrails?: A Little ?Help needed moving from lying on your back to sitting on the side of a flat bed without using bedrails?: A Little ?Help needed moving to and from a bed to a chair (including a wheelchair)?: A Little ?Help needed standing up from a chair using your arms (e.g., wheelchair or bedside chair)?: A Little ?Help needed to walk in hospital room?: A Little ?Help needed climbing 3-5 steps with a railing? : A Little ?6 Click Score: 18 ? ?  ?End of Session Equipment Utilized During Treatment: Gait belt ?Activity Tolerance: Patient tolerated treatment well ?Patient left: in chair;with call bell/phone within reach ?Nurse Communication: Mobility status ?PT Visit Diagnosis: Muscle weakness (generalized) (M62.81);Difficulty in walking, not elsewhere classified (R26.2) ?  ? ?Time: 1937-9024 ?PT Time Calculation (min) (ACUTE ONLY): 36 min ? ? ?Charges:   PT Evaluation ?$PT Eval Low Complexity: 1 Low ?PT Treatments ?$Gait Training: 8-22 mins ?  ?   ? ? ?Apolonio Schneiders  Q. PT, DPT ?Acute Rehabilitation Services ?Office [phone] ?Pager [phone]  ? ?Jacques Navy ?02/15/2022, 6:05 PM ?

## 2022-02-15 NOTE — Transfer of Care (Signed)
Immediate Anesthesia Transfer of Care Note ? ?Patient: Kristina Vang ? ?Procedure(s) Performed: TOTAL HIP ARTHROPLASTY ANTERIOR APPROACH (Right: Hip) ? ?Patient Location: PACU ? ?Anesthesia Type:Spinal ? ?Level of Consciousness: awake, alert , oriented and patient cooperative ? ?Airway & Oxygen Therapy: Patient Spontanous Breathing and Patient connected to face mask ? ?Post-op Assessment: Report given to RN and Post -op Vital signs reviewed and stable ? ?Post vital signs: Reviewed and stable ? ?Last Vitals:  ?Vitals Value Taken Time  ?BP    ?Temp    ?Pulse 59 02/15/22 1223  ?Resp 15 02/15/22 1223  ?SpO2 99 % 02/15/22 1223  ?Vitals shown include unvalidated device data. ? ?Last Pain:  ?Vitals:  ? 02/15/22 0754  ?TempSrc: Oral  ?PainSc:   ?   ? ?  ? ?Complications: No notable events documented. ?

## 2022-02-15 NOTE — Interval H&P Note (Signed)
History and Physical Interval Note: ? ?02/15/2022 ?7:22 AM ? ?Kristina Vang  has presented today for surgery, with the diagnosis of OA RIGHT HIP.  The various methods of treatment have been discussed with the patient and family. After consideration of risks, benefits and other options for treatment, the patient has consented to  Procedure(s): ?TOTAL HIP ARTHROPLASTY ANTERIOR APPROACH (Right) as a surgical intervention.  The patient's history has been reviewed, patient examined, no change in status, stable for surgery.  I have reviewed the patient's chart and labs.  Questions were answered to the patient's satisfaction.   ? ? ?Renette Butters ? ? ?

## 2022-02-15 NOTE — Anesthesia Postprocedure Evaluation (Signed)
Anesthesia Post Note ? ?Patient: Kristina Vang ? ?Procedure(s) Performed: TOTAL HIP ARTHROPLASTY ANTERIOR APPROACH (Right: Hip) ? ?  ? ?Patient location during evaluation: Phase II ?Anesthesia Type: Spinal ?Level of consciousness: awake and alert, oriented and patient cooperative ?Pain management: pain level controlled ?Vital Signs Assessment: post-procedure vital signs reviewed and stable ?Respiratory status: spontaneous breathing, nonlabored ventilation and respiratory function stable ?Cardiovascular status: blood pressure returned to baseline and stable ?Postop Assessment: no apparent nausea or vomiting, spinal receding, patient able to bend at knees, adequate PO intake and able to ambulate ?Anesthetic complications: no ? ? ?No notable events documented. ? ?Last Vitals:  ?Vitals:  ? 02/15/22 1615 02/15/22 1630  ?BP:    ?Pulse: (!) 58 (!) 52  ?Resp:    ?Temp:    ?SpO2: 91% 98%  ?  ?Last Pain:  ?Vitals:  ? 02/15/22 1600  ?TempSrc:   ?PainSc: 0-No pain  ? ? ?  ?  ?  ?  ?  ?  ? ?Maisley Hainsworth,E. Vivian Okelley ? ? ? ? ?

## 2022-02-15 NOTE — Op Note (Signed)
02/15/2022 ? ?11:42 AM ? ?PATIENT:  Kristina Vang  ? ?MRN: [id number] ? ?PRE-OPERATIVE DIAGNOSIS:  OA RIGHT HIP ? ?POST-OPERATIVE DIAGNOSIS:  OA RIGHT HIP ? ?PROCEDURE:  Procedure(s): ?TOTAL HIP ARTHROPLASTY ANTERIOR APPROACH ? ?PREOPERATIVE INDICATIONS:   ? ?Kristina Vang is an 71 y.o. female who has a diagnosis of <principal problem not specified> and elected for surgical management after failing conservative treatment.  The risks benefits and alternatives were discussed with the patient including but not limited to the risks of nonoperative treatment, versus surgical intervention including infection, bleeding, nerve injury, periprosthetic fracture, the need for revision surgery, dislocation, leg length discrepancy, blood clots, cardiopulmonary complications, morbidity, mortality, among others, and they were willing to proceed.   ? ? ?OPERATIVE REPORT  ?   ?SURGEON:   Renette Butters, MD ?   ?ASSISTANT:  Aggie Moats, PA-C, he was present and scrubbed throughout the case, critical for completion in a timely fashion, and for retraction, instrumentation, and closure. ? ?   ?ANESTHESIA:  General ?   ?COMPLICATIONS:  None.  ?   ?COMPONENTS:  [employer] acolade fit femur size 5 with a 36 mm -5 head ball and an acetabular shell size 52 with a  polyethylene liner ?   ?PROCEDURE IN DETAIL:  ? ?The patient was met in the holding area and  identified.  The appropriate hip was identified and marked at the operative site.  The patient was then transported to the OR  and  placed under anesthesia per that record.  At that point, the patient was  placed in the supine position and  secured to the operating room table and all bony prominences padded. He received pre-operative antibiotics ?   ?The operative lower extremity was prepped from the iliac crest to the distal leg.  Sterile draping was performed.  Time out was performed prior to incision.   ?   ?Skin incision was made just 2 cm lateral to the ASIS  extending in line with  the tensor fascia lata. Electrocautery was used to control all bleeders. I dissected down sharply to the fascia of the tensor fascia lata was confirmed that the muscle fibers beneath were running posteriorly. I then incised the fascia over the superficial tensor fascia lata in line with the incision. The fascia was elevated off the anterior aspect of the muscle the muscle was retracted posteriorly and protected throughout the case. I then used electrocautery to incise the tensor fascia lata fascia control and all bleeders. Immediately visible was the fat over top of the anterior neck and capsule. ? ?I removed the anterior fat from the capsule and elevated the rectus muscle off of the anterior capsule. I then removed a large time of capsule. The retractors were then placed over the anterior acetabulum as well as around the superior and inferior neck. ? ?I then made a femoral neck cut. Then used the power corkscrew to remove the femoral head from the acetabulum and thoroughly irrigated the acetabulum. I sized the femoral head. ?   ?I then exposed the deep acetabulum, cleared out any tissue including the ligamentum teres.   After adequate visualization, I excised the labrum, and then sequentially reamed.  I then impacted the acetabular implant into place using fluoroscopy for guidance.  Appropriate version and inclination was confirmed clinically matching their bony anatomy, and with fluoroscopy. ? ?I placed a 20 mm screw in the posterior/superio position with an excellent bite.  ? ? I then placed the polyethylene liner in  place ? ?I then adducted the leg and released the external rotators from the posterior femur allowing it to be easily delivered up lateral and anterior to the acetabulum for preparation of the femoral canal. ?   ?I then prepared the proximal femur using the cookie-cutter and then sequentially reamed and broached. ? ?A trial broach, neck, and head was utilized, and I reduced the hip and used  floroscopy to assess the neck length and femoral implant. ? ?I then impacted the femoral prosthesis into place into the appropriate version. The hip was then reduced and fluoroscopy confirmed appropriate position. Leg lengths were restored. ? ?I then irrigated the hip copiously again with, and repaired the fascia with Vicryl, followed by monocryl for the subcutaneous tissue, Monocryl for the skin, Steri-Strips and sterile gauze. The patient was then awakened and returned to PACU in stable and satisfactory condition. There were no complications. ? ?POST OPERATIVE PLAN: WBAT, DVT px: SCD's/TED, ambulation and chemical dvt px ? ?Edmonia Lynch, MD ?Orthopedic Surgeon ?[phone]  ? ? ? ?

## 2022-02-15 NOTE — Anesthesia Procedure Notes (Signed)
Spinal ? ?Patient location during procedure: OR ?End time: 02/15/2022 10:16 AM ?Reason for block: surgical anesthesia ?Staffing ?Performed: anesthesiologist  ?Anesthesiologist: Annye Asa, MD ?Preanesthetic Checklist ?Completed: patient identified, IV checked, site marked, risks and benefits discussed, surgical consent, monitors and equipment checked, pre-op evaluation and timeout performed ?Spinal Block ?Patient position: sitting ?Prep: DuraPrep and site prepped and draped ?Patient monitoring: blood pressure, continuous pulse ox, cardiac monitor and heart rate ?Approach: midline ?Location: L3-4 ?Injection technique: single-shot ?Needle ?Needle type: Pencan and Introducer  ?Needle gauge: 24 G ?Needle length: 9 cm ?Assessment ?Events: CSF return ?Additional Notes ?Pt identified in Operating room.  Monitors applied. Working IV access confirmed. Sterile prep, drape lumbar spine.  1% lido local L 3,4.  #24ga Pencan into clear CSF L 3,4.  '12mg'$  0.75% Bupivacaine with dextrose injected with asp CSF beginning and end of injection.  Patient asymptomatic, VSS, no heme aspirated, tolerated well.  Jenita Seashore, MD ?  ? ? ? ?

## 2022-02-16 ENCOUNTER — Encounter (HOSPITAL_COMMUNITY): Payer: Self-pay | Admitting: Orthopedic Surgery

## 2022-02-17 ENCOUNTER — Encounter: Payer: Self-pay | Admitting: Physical Therapy

## 2022-02-17 ENCOUNTER — Ambulatory Visit: Payer: Medicare Other | Attending: Orthopedic Surgery | Admitting: Physical Therapy

## 2022-02-17 ENCOUNTER — Other Ambulatory Visit: Payer: Self-pay

## 2022-02-17 DIAGNOSIS — R262 Difficulty in walking, not elsewhere classified: Secondary | ICD-10-CM | POA: Diagnosis present

## 2022-02-17 DIAGNOSIS — M6281 Muscle weakness (generalized): Secondary | ICD-10-CM

## 2022-02-17 DIAGNOSIS — M25551 Pain in right hip: Secondary | ICD-10-CM

## 2022-02-17 DIAGNOSIS — M25651 Stiffness of right hip, not elsewhere classified: Secondary | ICD-10-CM

## 2022-02-17 DIAGNOSIS — R2689 Other abnormalities of gait and mobility: Secondary | ICD-10-CM | POA: Diagnosis present

## 2022-02-17 NOTE — Therapy (Signed)
[facility] ?[facility] ?[place] ?[place], [place], [address] ?Phone: [phone]   Fax:  [phone] ? ?Physical Therapy Evaluation ? ?Patient Details  ?Name: Kristina Vang ?MRN: [id number] ?Date of Birth: 1950-12-07 ?Referring Provider (PT): Edmonia Lynch, MD ? ? ?Encounter Date: 02/17/2022 ? ? PT End of Session - 02/17/22 1016   ? ? Visit Number 1   ? Number of Visits 12   ? Date for PT Re-Evaluation 03/31/22   ? Authorization Type BCBS Medicare   ? PT Start Time 1016   ? PT Stop Time 1058   ? PT Time Calculation (min) 42 min   ? Activity Tolerance Patient tolerated treatment well   ? Behavior During Therapy [facility] for tasks assessed/performed   ? ?  ?  ? ?  ? ? ?Past Medical History:  ?Diagnosis Date  ? Arthritis   ? Cancer [facility])   ? ? ?Past Surgical History:  ?Procedure Laterality Date  ? COLONOSCOPY    ? TOTAL HIP ARTHROPLASTY Right 02/15/2022  ? Procedure: TOTAL HIP ARTHROPLASTY ANTERIOR APPROACH;  Surgeon: Renette Butters, MD;  Location: [facility];  Service: Orthopedics;  Laterality: Right;  ? ? ?There were no vitals filed for this visit. ? ? ? Subjective Assessment - 02/17/22 1025   ? ? Subjective Pt s/p R THA on Tuesday 02/15/22 with same day discharge. Reports increased paint his morning as nerve block wearing off but limited tolerance for taking pain meds.   ? Pertinent History R THA 02/15/22   ? Limitations Sitting;Standing;Walking;House hold activities   ? How long can you sit comfortably? 1 hr   ? How long can you stand comfortably? more comfortable than sitting but has not tried to stand for extended periods   ? How long can you walk comfortably? 10 minutes   ? Patient Stated Goals "walk comfortably w/o the walker or a cane w/o a limp"   ? Currently in Pain? Yes   ? Pain Score 7    6-7/10  ? Pain Location Hip   ? Pain Orientation Right   ? Pain Descriptors / Indicators Pins and needles;Restless;Sore   ? Pain Type Surgical pain   ? Pain Radiating  Towards down to R knee   ? Pain Onset In the past 7 days   ? Pain Frequency Constant   worsens with movement  ? Aggravating Factors  movement, sitting with increased pressure on R   ? Pain Relieving Factors repositioning, Tylenol, ice, elevate   ? ?  ?  ? ?  ? ? ? ? ? [facility] PT Assessment - 02/17/22 1016   ? ?  ? Assessment  ? Medical Diagnosis R THA - anterior approach   ? Referring Provider (PT) Edmonia Lynch, MD   ? Onset Date/Surgical Date 02/15/22   ? Hand Dominance Right   ? Next MD Visit 03/02/22   ? Prior Therapy h/o PT for her back   ?  ? Precautions  ? Precautions None   ?  ? Restrictions  ? Weight Bearing Restrictions No   ?  ? Balance Screen  ? Has the patient fallen in the past 6 months Yes   ? How many times? 2   ? Has the patient had a decrease in activity level because of a fear of falling?  Yes   ? Is the patient reluctant to leave their home because of a fear of falling?  No   ?  ?  Home Environment  ? Living Environment Private residence   ? Living Arrangements Spouse/significant other   ? Type of Home House   townhouse  ? Home Access Level entry   ? Home Layout One level   ? [place] - 4 wheels;Cane - single point;Bedside commode;Shower seat;Grab bars - tub/shower   ?  ? Prior Function  ? Level of Independence Independent   ? Vocation Retired   ? Leisure walking for exercise 3x/wk up to 1 mile   ?  ? Cognition  ? Overall Cognitive Status Within Functional Limits for tasks assessed   ?  ? Observation/Other Assessments  ? Focus on Therapeutic Outcomes (FOTO)  Hip: FS=35, predicted D/C FS=71   ?  ? Sensation  ? Light Touch Appears Intact   ?  ? ROM / Strength  ? AROM / PROM / Strength Strength   ?  ? Strength  ? Overall Strength Comments tested in sitting   ? Strength Assessment Site Hip;Knee;Ankle   ? Right/Left Hip Right;Left   ? Right Hip Flexion 2+/5   ? Right Hip Extension 4-/5   ? Right Hip ABduction 3+/5   ? Right Hip ADduction 4-/5   ? Left Hip Flexion 4/5   ? Left Hip Extension  4-/5   ? Left Hip ABduction 4/5   ? Left Hip ADduction 4/5   ? Right/Left Knee Right;Left   ? Right Knee Flexion 4/5   ? Right Knee Extension 4-/5   ? Left Knee Flexion 4+/5   ? Left Knee Extension 4+/5   ? Right/Left Ankle Right;Left   ? Right Ankle Dorsiflexion 4/5   ? Left Ankle Dorsiflexion 4/5   ?  ? Ambulation/Gait  ? Ambulation/Gait Yes   ? Ambulation/Gait Assistance 5: Supervision   ? Ambulation/Gait Assistance Details cues for increased stride length and upright posture looking ahead rather than down at feet   ? Ambulation Distance (Feet) 60 Feet   ? Assistive device Rolling walker   ? Gait Pattern Step-through pattern;Decreased step length - right;Decreased step length - left;Decreased stride length;Decreased weight shift to right;Decreased stance time - right;Trunk flexed;Antalgic   ? Ambulation Surface Level;Indoor   ? Gait velocity decreased   ? Gait Comments RW adjusted down 1 notch to reduce shoulder shrug/hike - may need further adjustment   ? ?  ?  ? ?  ? ? ? ? ? ? ? ? ? ? ? ? ? ?Objective measurements completed on examination: See above findings.  ? ? ? ? ? [facility] Adult PT Treatment/Exercise - 02/17/22 1016   ? ?  ? Exercises  ? Exercises Knee/Hip   ?  ? Knee/Hip Exercises: Standing  ? Hip Flexion Right;Left;10 reps;AROM;Stengthening;Knee bent   ? Hip Flexion Limitations UE support on RW   ? Hip Abduction Right;Left;10 reps;AROM;Stengthening;Knee straight   ? Abduction Limitations UE support on RW   ? Hip Extension Right;Left;10 reps;AROM;Stengthening;Knee straight   ? Extension Limitations UE support on RW   ?  ? Knee/Hip Exercises: Seated  ? Long Arc Quad Right;10 reps;AROM   ? Heel Slides Right;10 reps;AROM   ? ?  ?  ? ?  ? ? ? ? ? ? ? ? ? ? ? ? PT Short Term Goals - 02/17/22 1058   ? ?  ? PT SHORT TERM GOAL #1  ? Title Patient will be independent in initial HEP to improve strength/mobility for better functional independence with ADLs   ? Status New   ?  Target Date 03/03/22   ? ?  ?  ? ?  ? ? ? ?  PT Long Term Goals - 02/17/22 1058   ? ?  ? PT LONG TERM GOAL #1  ? Title Patient will demonstrate independent use of ongoing/advanced HEP to facilitate ability to maintain/progress functional gains from skilled physical therapy services   ? Status New   ? Target Date 03/31/22   ?  ? PT LONG TERM GOAL #2  ? Title Patient to report reduction in frequency and intensity of R hip pain by >/= 50-75% to allow for improved activity tolerance   ? Status New   ? Target Date 03/31/22   ?  ? PT LONG TERM GOAL #3  ? Title Patient will demonstrate improved B LE strength to >/= 4 to 4+/5 for improved stability and ease of mobility   ? Status New   ? Target Date 03/31/22   ?  ? PT LONG TERM GOAL #4  ? Title Patient will ambulate with normal gait pattern and good gait stability w/o AD   ? Status New   ? Target Date 03/31/22   ?  ? PT LONG TERM GOAL #5  ? Title Patient to report ability to perform ADLs, household, and leisure activities without limitation due to R hip pain, LOM or weakness   ? Status New   ? Target Date 03/31/22   ?  ? PT LONG TERM GOAL #6  ? Title Patient will improve hip FOTO to >/= 71 to demonstrate improving function   ? Status New   ? Target Date 03/31/22   ? ?  ?  ? ?  ? ? ? ? ? ? ? ? ? Plan - 02/17/22 1058   ? ? Clinical Impression Statement Debroah is a 71 y/o female who presents to OP PT 2 days s/p R anterior approach THA on 02/15/22 with discharge home from the hospital DOS. She ambulates into the clinic with a RW demonstrating an antalgic step-to pattern with flexed trunk. She notes increasing pain since spinal nerve block wore off. Deficits include R hip pain, decreased R hip AROM, decreased B LE strength (R>L) and impaired transfers and gait. Reviewed hospital issued HEP but exercises not progressed due to increased pain. Brittannie will benefit from skilled PT for LE strengthening to promote stability for her THA prosthesis as well as mobility/transfer training, balance training and gait training to maximize  functional recovery and allow for return to normal activity.   ? Personal Factors and Comorbidities Comorbidity 2   ? Comorbidities OA, back pain   ? Examination-Activity Limitations Bathing;Bed Mobility;Be

## 2022-02-21 ENCOUNTER — Ambulatory Visit: Payer: Medicare Other | Admitting: Physical Therapy

## 2022-02-21 ENCOUNTER — Other Ambulatory Visit: Payer: Self-pay

## 2022-02-21 ENCOUNTER — Encounter: Payer: Self-pay | Admitting: Physical Therapy

## 2022-02-21 DIAGNOSIS — M6281 Muscle weakness (generalized): Secondary | ICD-10-CM

## 2022-02-21 DIAGNOSIS — M25651 Stiffness of right hip, not elsewhere classified: Secondary | ICD-10-CM

## 2022-02-21 DIAGNOSIS — M25551 Pain in right hip: Secondary | ICD-10-CM

## 2022-02-21 DIAGNOSIS — R262 Difficulty in walking, not elsewhere classified: Secondary | ICD-10-CM

## 2022-02-21 DIAGNOSIS — R2689 Other abnormalities of gait and mobility: Secondary | ICD-10-CM

## 2022-02-21 NOTE — Patient Instructions (Signed)
? ? ?  Access Code: [email or web address] ?URL: [email or web address] ?Date: 02/21/2022 ?Prepared by: Annie Paras ? ?Exercises ?- Standing 3-way Hip with Walker  - 1 x daily - 7 x weekly - 2 sets - 10 reps - 3 sec hold ?- Mini Squat with Counter Support  - 1 x daily - 4-5 x weekly - 2 sets - 10 reps - 3-5 sec hold ?- Heel Toe Raises with Counter Support  - 1 x daily - 4-5 x weekly - 2 sets - 10 reps - 3 sec hold ?- Seated Hip Adduction Squeeze with Ball  - 1 x daily - 4-5 x weekly - 2 sets - 10 reps - 3-5 sec hold ?- Seated Isometric Hip Abduction with Resistance  - 1 x daily - 4-5 x weekly - 2 sets - 10 reps - 3 sec hold ? ?

## 2022-02-21 NOTE — Therapy (Signed)
[facility] ?[facility] ?[place] ?[place], [place], [address] ?Phone: [phone]   Fax:  [phone] ? ?Physical Therapy Treatment ? ?Patient Details  ?Name: Kristina Vang ?MRN: [id number] ?Date of Birth: [birth year] ?Referring Provider (PT): Edmonia Lynch, MD ? ? ?Encounter Date: 02/21/2022 ? ? PT End of Session - 02/21/22 1016   ? ? Visit Number 2   ? Number of Visits 12   ? Date for PT Re-Evaluation 03/31/22   ? Authorization Type BCBS Medicare   ? PT Start Time 1016   ? PT Stop Time 1058   ? PT Time Calculation (min) 42 min   ? Activity Tolerance Patient tolerated treatment well   ? Behavior During Therapy [facility] for tasks assessed/performed   ? ?  ?  ? ?  ? ? ?Past Medical History:  ?Diagnosis Date  ? Arthritis   ? Cancer [facility])   ? ? ?Past Surgical History:  ?Procedure Laterality Date  ? COLONOSCOPY    ? TOTAL HIP ARTHROPLASTY Right 02/15/2022  ? Procedure: TOTAL HIP ARTHROPLASTY ANTERIOR APPROACH;  Surgeon: Renette Butters, MD;  [facility];  Service: Orthopedics;  Laterality: Right;  ? ? ?There were no vitals filed for this visit. ? ? Subjective Assessment - 02/21/22 1019   ? ? Subjective Pt reports she is now just feeling the incision tightness more than pain.   ? Pertinent History R THA 02/15/22   ? Patient Stated Goals "walk comfortably w/o the walker or a cane w/o a limp"   ? Currently in Pain? Yes   ? Pain Score 3    ? Pain Location Hip   ? Pain Orientation Right   ? Pain Descriptors / Indicators Tightness   ? Pain Type Surgical pain   ? Pain Frequency Intermittent   ? Pain Relieving Factors Tylenol   ? ?  ?  ? ?  ? ? ? ? ? ? ? ? ? ? ? ? ? ? ? ? ? ? ? ? [facility] Adult PT Treatment/Exercise - 02/21/22 0001   ? ?  ? Ambulation/Gait  ? Ambulation/Gait Assistance 5: Supervision;6: Modified independent (Device/Increase time)   ? Ambulation/Gait Assistance Details cues for heel strike with heel-toe progression and increased stride length   ? Ambulation  Distance (Feet) 180 Feet   ? Assistive device Rolling walker   ? Gait Pattern Step-through pattern;Decreased step length - right;Decreased step length - left;Decreased stride length;Decreased weight shift to right;Decreased stance time - right;Trunk flexed;Antalgic   ? Ambulation Surface Level;Indoor   ?  ? Knee/Hip Exercises: Aerobic  ? Nustep L3 x 6 min  (UE/LE)   ?  ? Knee/Hip Exercises: Standing  ? Heel Raises Both;2 sets;10 reps;3 seconds   ? Heel Raises Limitations UE support on RW   ? Knee Flexion Right;Left;2 sets;10 reps;AROM;Strengthening   ? Knee Flexion Limitations UE support on RW   ? Hip Flexion Right;Left;2 sets;10 reps;AROM;Stengthening;Knee bent   ? Hip Flexion Limitations march; UE support on RW   ? Hip Abduction Right;Left;2 sets;10 reps;AROM;Stengthening;Knee straight   ? Abduction Limitations UE support on RW   ? Hip Extension Right;Left;2 sets;10 reps;AROM;Stengthening;Knee straight   ? Extension Limitations UE support on RW   ? Functional Squat 2 sets;10 reps;3 seconds   ? Functional Squat Limitations UE support on counter   ?  ? Knee/Hip Exercises: Seated  ? Long Arc Quad Right;Left;2 sets;10 reps;AROM;Strengthening   ? Ball Squeeze 10 x  5"   ? Clamshell with TheraBand Red   Alt single leg with band held around legs and crossed over thighs 10 x 3", 2 sets  ? Other Seated Knee/Hip Exercises Heel/toe raises 20 x 3"   ? ?  ?  ? ?  ? ? ? ? ? ? ? ? ? ? PT Education - 02/21/22 1057   ? ? Education Details Initial HEP   ? Person(s) Educated Patient   ? Methods Explanation;Demonstration;Verbal cues;Handout   ? Comprehension Verbalized understanding;Verbal cues required;Returned demonstration;Need further instruction   ? ?  ?  ? ?  ? ? ? PT Short Term Goals - 02/21/22 1021   ? ?  ? PT SHORT TERM GOAL #1  ? Title Patient will be independent in initial HEP to improve strength/mobility for better functional independence with ADLs   ? Status On-going   ? Target Date 03/03/22   ? ?  ?  ? ?  ? ? ? ? PT Long  Term Goals - 02/21/22 1021   ? ?  ? PT LONG TERM GOAL #1  ? Title Patient will demonstrate independent use of ongoing/advanced HEP to facilitate ability to maintain/progress functional gains from skilled physical therapy services   ? Status On-going   ? Target Date 03/31/22   ?  ? PT LONG TERM GOAL #2  ? Title Patient to report reduction in frequency and intensity of R hip pain by >/= 50-75% to allow for improved activity tolerance   ? Status On-going   ? Target Date 03/31/22   ?  ? PT LONG TERM GOAL #3  ? Title Patient will demonstrate improved B LE strength to >/= 4 to 4+/5 for improved stability and ease of mobility   ? Status On-going   ? Target Date 03/31/22   ?  ? PT LONG TERM GOAL #4  ? Title Patient will ambulate with normal gait pattern and good gait stability w/o AD   ? Status On-going   ? Target Date 03/31/22   ?  ? PT LONG TERM GOAL #5  ? Title Patient to report ability to perform ADLs, household, and leisure activities without limitation due to R hip pain, LOM or weakness   ? Status On-going   ? Target Date 03/31/22   ?  ? PT LONG TERM GOAL #6  ? Title Patient will improve hip FOTO to >/= 71 to demonstrate improving function   ? Status On-going   ? Target Date 03/31/22   ? ?  ?  ? ?  ? ? ? ? ? ? ? ? Plan - 02/21/22 1021   ? ? Clinical Impression Statement Jade reports pain has subsided over the past few days, now noting more tightness/discomfort at the surgical incision. She states she has mostly been doing the sitting and standing exercises at home as she does not typically go back to bed once she gets up in the morning. Reviewed and progressed seated and standing exercises with good tolerance other than some fatigue noted - HEP instructions provided for home reference. Gait training provided with cues for heel strike on weight acceptance and heel-toe progression with increasing stride length with good carryover noted   ? Comorbidities OA, back pain   ? Rehab Potential Excellent   ? PT Frequency 2x /  week   ? PT Duration 6 weeks   ? PT Treatment/Interventions ADLs/Self Care Home Management;Cryotherapy;Electrical Stimulation;Moist Heat;Ultrasound;DME Instruction;Gait training;Stair training;Functional mobility training;Therapeutic activities;Therapeutic exercise;Balance training;Neuromuscular re-education;Manual techniques;Scar mobilization;Passive  range of motion;Dry needling;Taping   ? PT Next Visit Plan Further review of hospital issued HEP with progression/expansion as indicated; R hip ROM and B LE strengthening; gait training to normalize gait pattern   ? PT Home Exercise Plan hospital issued HEP;  Access Code: [email or web address]   ? Consulted and Agree with Plan of Care Patient   ? ?  ?  ? ?  ? ? ?Patient will benefit from skilled therapeutic intervention in order to improve the following deficits and impairments:  Abnormal gait, Decreased activity tolerance, Decreased balance, Decreased endurance, Decreased mobility, Decreased range of motion, Decreased strength, Difficulty walking, Increased edema, Increased fascial restricitons, Increased muscle spasms, Impaired perceived functional ability, Impaired flexibility, Improper body mechanics, Postural dysfunction, Pain ? ?Visit Diagnosis: ?Pain in right hip ? ?Stiffness of right hip, not elsewhere classified ? ?Muscle weakness (generalized) ? ?Other abnormalities of gait and mobility ? ?Difficulty in walking, not elsewhere classified ? ? ? ? ?Problem List ?Patient Active Problem List  ? Diagnosis Date Noted  ? 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 ? ?Percival Spanish, PT ?02/21/2022, 11:30 AM ? ?[facility] ?[facility] ?[address] ?[place], [place], [address] ?Phone: [phone]   Fax:  [phone] ? ?Name: Ailen Strauch Wich ?MRN: [id number] ?Date of Birth: 20-Jun-1951 ? ? ? ?

## 2022-02-24 ENCOUNTER — Encounter: Payer: Medicare Other | Admitting: Physical Therapy

## 2022-02-25 ENCOUNTER — Ambulatory Visit: Payer: Medicare Other

## 2022-02-25 DIAGNOSIS — R262 Difficulty in walking, not elsewhere classified: Secondary | ICD-10-CM

## 2022-02-25 DIAGNOSIS — R2689 Other abnormalities of gait and mobility: Secondary | ICD-10-CM

## 2022-02-25 DIAGNOSIS — M25651 Stiffness of right hip, not elsewhere classified: Secondary | ICD-10-CM

## 2022-02-25 DIAGNOSIS — M25551 Pain in right hip: Secondary | ICD-10-CM | POA: Diagnosis not present

## 2022-02-25 DIAGNOSIS — M6281 Muscle weakness (generalized): Secondary | ICD-10-CM

## 2022-02-25 NOTE — Therapy (Signed)
?[facility] ?[place] ?[place], [place], [address] ?Phone: [phone]   Fax:  [phone] ? ?Physical Therapy Treatment ? ?Patient Details  ?Name: Kristina Vang ?MRN: [id number] ?Date of Birth: 11-30-1950 ?Referring Provider (PT): Edmonia Lynch, MD ? ? ?Encounter Date: 02/25/2022 ? ? PT End of Session - 02/25/22 0930   ? ? Visit Number 3   ? Number of Visits 12   ? Date for PT Re-Evaluation 03/31/22   ? Authorization Type BCBS Medicare   ? PT Start Time 0848   ? PT Stop Time 0093   ? PT Time Calculation (min) 39 min   ? Activity Tolerance Patient tolerated treatment well   ? Behavior During Therapy [facility] for tasks assessed/performed   ? ?  ?  ? ?  ? ? ?Past Medical History:  ?Diagnosis Date  ? Arthritis   ? Cancer [facility])   ? ? ?Past Surgical History:  ?Procedure Laterality Date  ? COLONOSCOPY    ? TOTAL HIP ARTHROPLASTY Right 02/15/2022  ? Procedure: TOTAL HIP ARTHROPLASTY ANTERIOR APPROACH;  Surgeon: Renette Butters, MD;  [facility];  Service: Orthopedics;  Laterality: Right;  ? ? ?There were no vitals filed for this visit. ? ? Subjective Assessment - 02/25/22 0849   ? ? Subjective Pt is doing well, just some soreness from exercises.   ? Pertinent History R THA 02/15/22   ? Patient Stated Goals "walk comfortably w/o the walker or a cane w/o a limp"   ? Currently in Pain? Yes   ? Pain Score 1    ? Pain Location Hip   ? Pain Orientation Right   ? Pain Descriptors / Indicators Tightness   ? Pain Type Surgical pain   ? ?  ?  ? ?  ? ? ? ? ? ? ? ? ? ? ? ? ? ? ? ? ? ? ? ? [facility] Adult PT Treatment/Exercise - 02/25/22 0001   ? ?  ? Ambulation/Gait  ? Ambulation Distance (Feet) 270 Feet   ? Assistive device Straight cane   ? Gait Comments cane adjusted to proper height, pt showed decreased R stance time and decreased L step length but mild   ?  ? Knee/Hip Exercises: Standing  ? Hip Flexion Right;Left;2 sets;10 reps;AROM;Stengthening;Knee bent   2 sets   ? Hip Flexion Limitations march; counter support   ? Hip Abduction Stengthening;Both;10 reps;Knee straight   2#  ? Abduction Limitations counter support   ? Hip Extension Stengthening;Both;10 reps;Knee straight   2#  ? Extension Limitations counter support   ? Functional Squat 10 reps;2 sets   ? Other Standing Knee Exercises church pews no UEsupport 20 reps   ?  ? Knee/Hip Exercises: Seated  ? Long [employer] Strengthening;Both;10 reps   ? Long [employer] Limitations with VMO ball squeeze   ? Clamshell with TheraBand Red   10x5", Alt single leg with band held around legs and crossed over thighs  ? Hamstring Curl Strengthening;Both;10 reps   ? Hamstring Limitations RTB   ?  ? Knee/Hip Exercises: Supine  ? Bridges Strengthening;Both;2 sets;10 reps   ? ?  ?  ? ?  ? ? ? ? ? ? ? ? ? ? ? ? PT Short Term Goals - 02/21/22 1021   ? ?  ? PT SHORT TERM GOAL #1  ? Title Patient will be independent in initial HEP to improve strength/mobility for better functional  independence with ADLs   ? Status On-going   ? Target Date 03/03/22   ? ?  ?  ? ?  ? ? ? ? PT Long Term Goals - 02/21/22 1021   ? ?  ? PT LONG TERM GOAL #1  ? Title Patient will demonstrate independent use of ongoing/advanced HEP to facilitate ability to maintain/progress functional gains from skilled physical therapy services   ? Status On-going   ? Target Date 03/31/22   ?  ? PT LONG TERM GOAL #2  ? Title Patient to report reduction in frequency and intensity of R hip pain by >/= 50-75% to allow for improved activity tolerance   ? Status On-going   ? Target Date 03/31/22   ?  ? PT LONG TERM GOAL #3  ? Title Patient will demonstrate improved B LE strength to >/= 4 to 4+/5 for improved stability and ease of mobility   ? Status On-going   ? Target Date 03/31/22   ?  ? PT LONG TERM GOAL #4  ? Title Patient will ambulate with normal gait pattern and good gait stability w/o AD   ? Status On-going   ? Target Date 03/31/22   ?  ? PT LONG TERM GOAL #5  ? Title Patient to report  ability to perform ADLs, household, and leisure activities without limitation due to R hip pain, LOM or weakness   ? Status On-going   ? Target Date 03/31/22   ?  ? PT LONG TERM GOAL #6  ? Title Patient will improve hip FOTO to >/= 71 to demonstrate improving function   ? Status On-going   ? Target Date 03/31/22   ? ?  ?  ? ?  ? ? ? ? ? ? ? ? Plan - 02/25/22 0932   ? ? Clinical Impression Statement Pt demonstrated good carryover from gait training last visit, showing good heel-toe pattern with only mild antalgic gait. Good response to progressed TE, added weights and decreased UE support. Cuing given as needed with exercises to prevent compensating. She is functional with her RW, will continue to practice gait with cane to wean to a LRAD.   ? Personal Factors and Comorbidities Comorbidity 2   ? Comorbidities OA, back pain   ? PT Frequency 2x / week   ? PT Duration 6 weeks   ? PT Treatment/Interventions ADLs/Self Care Home Management;Cryotherapy;Electrical Stimulation;Moist Heat;Ultrasound;DME Instruction;Gait training;Stair training;Functional mobility training;Therapeutic activities;Therapeutic exercise;Balance training;Neuromuscular re-education;Manual techniques;Scar mobilization;Passive range of motion;Dry needling;Taping   ? PT Next Visit Plan Further review of hospital issued HEP with progression/expansion as indicated; R hip ROM and B LE strengthening; gait training to normalize gait pattern   ? PT Home Exercise Plan hospital issued HEP;  Access Code: [email or web address]   ? Consulted and Agree with Plan of Care Patient   ? ?  ?  ? ?  ? ? ?Patient will benefit from skilled therapeutic intervention in order to improve the following deficits and impairments:  Abnormal gait, Decreased activity tolerance, Decreased balance, Decreased endurance, Decreased mobility, Decreased range of motion, Decreased strength, Difficulty walking, Increased edema, Increased fascial restricitons, Increased muscle spasms, Impaired perceived  functional ability, Impaired flexibility, Improper body mechanics, Postural dysfunction, Pain ? ?Visit Diagnosis: ?Pain in right hip ? ?Stiffness of right hip, not elsewhere classified ? ?Muscle weakness (generalized) ? ?Other abnormalities of gait and mobility ? ?Difficulty in walking, not elsewhere classified ? ? ? ? ?Problem List ?Patient Active Problem List  ? Diagnosis Date  Noted  ? 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 ? ?Artist Pais, PTA ?02/25/2022, 10:48 AM ? ? ?[facility] ?[place] ?[place], [place], [address] ?Phone: [phone]   Fax:  [phone] ? ?Name: Kashauna Celmer Penrose ?MRN: [id number] ?Date of Birth: 10-09-1951 ? ? ? ?

## 2022-02-28 ENCOUNTER — Ambulatory Visit: Payer: Medicare Other | Attending: Orthopedic Surgery

## 2022-02-28 DIAGNOSIS — R2689 Other abnormalities of gait and mobility: Secondary | ICD-10-CM | POA: Diagnosis present

## 2022-02-28 DIAGNOSIS — M25651 Stiffness of right hip, not elsewhere classified: Secondary | ICD-10-CM | POA: Diagnosis present

## 2022-02-28 DIAGNOSIS — M25551 Pain in right hip: Secondary | ICD-10-CM | POA: Diagnosis present

## 2022-02-28 DIAGNOSIS — M6281 Muscle weakness (generalized): Secondary | ICD-10-CM | POA: Insufficient documentation

## 2022-02-28 DIAGNOSIS — R262 Difficulty in walking, not elsewhere classified: Secondary | ICD-10-CM | POA: Insufficient documentation

## 2022-02-28 NOTE — Therapy (Signed)
[place] [facility] ?[address] ?[place], [place], [address] ?Phone: [phone]   Fax:  [phone] ? ?Physical Therapy Treatment ? ?Patient Details  ?Name: Kristina Vang ?MRN: [id number] ?Date of Birth: 01-16-1951 ?Referring Provider (PT): Edmonia Lynch, MD ? ? ?Encounter Date: 02/28/2022 ? ? PT End of Session - 02/28/22 1100   ? ? Visit Number 4   ? Number of Visits 12   ? Date for PT Re-Evaluation 03/31/22   ? Authorization Type BCBS Medicare   ? PT Start Time 4081   pt late  ? PT Stop Time 1057   ? PT Time Calculation (min) 39 min   ? Activity Tolerance Patient tolerated treatment well   ? Behavior During Therapy [facility] for tasks assessed/performed   ? ?  ?  ? ?  ? ? ?Past Medical History:  ?Diagnosis Date  ? Arthritis   ? Cancer [facility])   ? ? ?Past Surgical History:  ?Procedure Laterality Date  ? COLONOSCOPY    ? TOTAL HIP ARTHROPLASTY Right 02/15/2022  ? Procedure: TOTAL HIP ARTHROPLASTY ANTERIOR APPROACH;  Surgeon: Renette Butters, MD;  Location: [facility];  Service: Orthopedics;  Laterality: Right;  ? ? ?There were no vitals filed for this visit. ? ? Subjective Assessment - 02/28/22 1021   ? ? Subjective Pt reports soreness from doing laundry and other household activities yesterday.   ? Pertinent History R THA 02/15/22   ? Patient Stated Goals "walk comfortably w/o the walker or a cane w/o a limp"   ? Currently in Pain? Yes   ? Pain Score 1    ? Pain Location Hip   ? Pain Orientation Right   ? Pain Descriptors / Indicators Sore   ? Pain Type Surgical pain   ? ?  ?  ? ?  ? ? ? ? ? [facility] PT Assessment - 02/28/22 0001   ? ?  ? Strength  ? Overall Strength Comments tested in sitting   ? Right Hip Flexion 3/5   ? Right Hip ABduction 4/5   ? Right Hip ADduction 4/5   ? Left Hip Flexion 4/5   ? Left Hip ABduction 4+/5   ? Left Hip ADduction 4+/5   ? Right Knee Flexion 4/5   ? Right Knee Extension 4-/5   ? ?  ?  ? ?  ? ? ? ? ? ? ? ? ? ? ? ? ? ? ? ? [facility] Adult PT  Treatment/Exercise - 02/28/22 0001   ? ?  ? Ambulation/Gait  ? Ambulation Distance (Feet) 450 Feet   ? Assistive device Straight cane   ? Gait Comments pt showed good stability, no decreased in gait speed with SPC, cues to correct gait pattern once   ?  ? Knee/Hip Exercises: Aerobic  ? Nustep L5 x 6 min  (UE/LE)   ?  ? Knee/Hip Exercises: Standing  ? Other Standing Knee Exercises church pews no UE support 20 reps   ? Other Standing Knee Exercises retro step at wall x 10   ?  ? Knee/Hip Exercises: Supine  ? Bridges with Greig Right Strengthening;Both;10 reps   ? Bridges with Clamshell Strengthening;Both;10 reps   with RTB  ? Other Supine Knee/Hip Exercises clams with RTB 10x3"   ? ?  ?  ? ?  ? ? ? ? ? ? ? ? ? ? ? ? PT Short Term Goals - 02/28/22 1025   ? ?  ?  PT SHORT TERM GOAL #1  ? Title Patient will be independent in initial HEP to improve strength/mobility for better functional independence with ADLs   ? Status Achieved   02/28/22  ? Target Date 03/03/22   ? ?  ?  ? ?  ? ? ? ? PT Long Term Goals - 02/28/22 1033   ? ?  ? PT LONG TERM GOAL #1  ? Title Patient will demonstrate independent use of ongoing/advanced HEP to facilitate ability to maintain/progress functional gains from skilled physical therapy services   ? Status On-going   ? Target Date 03/31/22   ?  ? PT LONG TERM GOAL #2  ? Title Patient to report reduction in frequency and intensity of R hip pain by >/= 50-75% to allow for improved activity tolerance   ? Status On-going   ? Target Date 03/31/22   ?  ? PT LONG TERM GOAL #3  ? Title Patient will demonstrate improved B LE strength to >/= 4 to 4+/5 for improved stability and ease of mobility   ? Status On-going   02/28/22- see flowsheet  ? Target Date 03/31/22   ?  ? PT LONG TERM GOAL #4  ? Title Patient will ambulate with normal gait pattern and good gait stability w/o AD   ? Status Partially Met   good stability and gait pattern shown with SPC  ? Target Date 03/31/22   ?  ? PT LONG TERM GOAL #5  ? Title  Patient to report ability to perform ADLs, household, and leisure activities without limitation due to R hip pain, LOM or weakness   ? Status On-going   ? Target Date 03/31/22   ?  ? PT LONG TERM GOAL #6  ? Title Patient will improve hip FOTO to >/= 71 to demonstrate improving function   ? Status On-going   ? Target Date 03/31/22   ? ?  ?  ? ?  ? ? ? ? ? ? ? ? Plan - 02/28/22 1102   ? ? Clinical Impression Statement Pt denies any questions or concerns as she feels independent with initial HEP - STG met. She continues to progress with gait using SPC, tried ambulating w/o an AD but this increased antalgic gait but pt does show readiness to wean to a cane. LE strength test shows some improvement but still more focus on global strength needs to be done. She is able to progress exercises just fine with instructions given by PTA. Pt responded well w/o any increased pain.   ? Personal Factors and Comorbidities Comorbidity 2   ? Comorbidities OA, back pain   ? PT Frequency 2x / week   ? PT Duration 6 weeks   ? PT Treatment/Interventions ADLs/Self Care Home Management;Cryotherapy;Electrical Stimulation;Moist Heat;Ultrasound;DME Instruction;Gait training;Stair training;Functional mobility training;Therapeutic activities;Therapeutic exercise;Balance training;Neuromuscular re-education;Manual techniques;Scar mobilization;Passive range of motion;Dry needling;Taping   ? PT Next Visit Plan R hip ROM and B LE strengthening; gait training to normalize gait pattern   ? PT Home Exercise Plan hospital issued HEP;  Access Code: [email or web address]   ? Consulted and Agree with Plan of Care Patient   ? ?  ?  ? ?  ? ? ?Patient will benefit from skilled therapeutic intervention in order to improve the following deficits and impairments:  Abnormal gait, Decreased activity tolerance, Decreased balance, Decreased endurance, Decreased mobility, Decreased range of motion, Decreased strength, Difficulty walking, Increased edema, Increased fascial  restricitons, Increased muscle spasms, Impaired perceived functional ability, Impaired flexibility, Improper  body mechanics, Postural dysfunction, Pain ? ?Visit Diagnosis: ?Pain in right hip ? ?Stiffness of right hip, not elsewhere classified ? ?Muscle weakness (generalized) ? ?Other abnormalities of gait and mobility ? ?Difficulty in walking, not elsewhere classified ? ? ? ? ?Problem List ?Patient Active Problem List  ? Diagnosis Date Noted  ? 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 ? ?Artist Pais, PTA ?02/28/2022, 12:08 PM ? ?[place] ?[facility] ?[address] ?[place], [place], [address] ?Phone: [phone]   Fax:  [phone] ? ?Name: Kristina Vang ?MRN: [id number] ?Date of Birth: 08-28-1951 ? ? ? ?

## 2022-03-03 ENCOUNTER — Ambulatory Visit: Payer: Medicare Other | Admitting: Physical Therapy

## 2022-03-03 ENCOUNTER — Encounter: Payer: Self-pay | Admitting: Physical Therapy

## 2022-03-03 DIAGNOSIS — M25651 Stiffness of right hip, not elsewhere classified: Secondary | ICD-10-CM

## 2022-03-03 DIAGNOSIS — M25551 Pain in right hip: Secondary | ICD-10-CM | POA: Diagnosis not present

## 2022-03-03 DIAGNOSIS — R262 Difficulty in walking, not elsewhere classified: Secondary | ICD-10-CM

## 2022-03-03 DIAGNOSIS — R2689 Other abnormalities of gait and mobility: Secondary | ICD-10-CM

## 2022-03-03 DIAGNOSIS — M6281 Muscle weakness (generalized): Secondary | ICD-10-CM

## 2022-03-03 NOTE — Therapy (Addendum)
[place] ?[facility] ?[address] ?[place], [place], [address] ?Phone: [phone]   Fax:  [phone] ? ?Physical Therapy Treatment ? ?Patient Details  ?Name: Kristina Vang ?MRN: [id number] ?Date of Birth: October 31, 1951 ?Referring Provider (PT): Kristina Lynch, MD ? ? ?Encounter Date: 03/03/2022 ? ? PT End of Session - 03/03/22 1017   ? ? Visit Number 5   ? Number of Visits 12   ? Date for PT Re-Evaluation 03/31/22   ? Authorization Type BCBS Medicare   ? PT Start Time 1017   ? PT Stop Time 1057   ? PT Time Calculation (min) 40 min   ? Activity Tolerance Patient tolerated treatment well   ? Behavior During Therapy [facility] for tasks assessed/performed   ? ?  ?  ? ?  ? ? ?Past Medical History:  ?Diagnosis Date  ? Arthritis   ? Cancer [facility])   ? ? ?Past Surgical History:  ?Procedure Laterality Date  ? COLONOSCOPY    ? TOTAL HIP ARTHROPLASTY Right 02/15/2022  ? Procedure: TOTAL HIP ARTHROPLASTY ANTERIOR APPROACH;  Surgeon: Kristina Butters, MD;  Location: [facility];  Service: Orthopedics;  Laterality: Right;  ? ? ?There were no vitals filed for this visit. ? ? Subjective Assessment - 03/03/22 1020   ? ? Subjective Pt reports she had her f/u with the surgeon yesterday and he was pleased with her progress. She is now consistently using the [facility] and has increased her walking as a result - notes some increased muscle soreness from the increased number of steps. No pain at hip but feels more resistance at her surgical incision now that the bandage has been removed. Still having some numbness in her thigh but this is slowly improving.   ? Pertinent History R THA 02/15/22   ? Patient Stated Goals "walk comfortably w/o the walker or a cane w/o a limp"   ? Currently in Pain? No/denies   ? ?  ?  ? ?  ? ? ? ? ? ? ? ? ? ? ? ? ? ? ? ? ? ? ? ? Kristina Vang Adult PT Treatment/Exercise - 03/03/22 1017   ? ?  ? Knee/Hip Exercises: Aerobic  ? Nustep L6 x 6 min  (UE/LE)   ?  ? Knee/Hip Exercises:  Standing  ? Hip Flexion Right;Left;10 reps;AROM;Stengthening;Knee bent   ? Hip Flexion Limitations yellow TB march with looped band at feet; UE support on RW   ? Hip Abduction Right;Left;2 sets;10 reps;AROM;Stengthening;Knee straight   ? Abduction Limitations looped yellow TB at ankles, UE support on counter   ? Hip Extension Right;Left;10 reps;AROM;Stengthening;Knee straight   ? Extension Limitations looped yellow TB at ankles, UE support on counter   ? Lateral Step Up Right;10 reps;Step Height: 6";Hand Hold: 2   ? Forward Step Up Right;10 reps;Step Height: 6";Hand Hold: 2;Hand Hold: 1   initially SPC & window sill for support, weaning to just [facility]  ? Forward Step Up Limitations initially SPC & window sill for support, weaning to just [facility]   ? Functional Squat 10 reps;2 sets   ? Functional Squat Limitations cues for posterior wt shift avoiding knees fwd of toes   ? SLS with Vectors R/L SLS with opp LE 4-way slides to colored dots x 10   ?  ? Knee/Hip Exercises: Seated  ? Other Seated Knee/Hip Exercises Fitter R LE leg press (1 black/1 blue) 2 x 10   ? Marching Both;10 reps   ?  Marching Limitations yellow TB at knees; cues for abd bracing to avoid posterior lean/pelvic tilt   ? Sit to Sand 10 reps;without UE support   cues for even wt shift  ? ?  ?  ? ?  ? ? ? ? ? ? ? ? ? ? ? ? PT Short Term Goals - 02/28/22 1025   ? ?  ? PT SHORT TERM GOAL #1  ? Title Patient will be independent in initial HEP to improve strength/mobility for better functional independence with ADLs   ? Status Achieved   02/28/22  ? Target Date 03/03/22   ? ?  ?  ? ?  ? ? ? ? PT Long Term Goals - 02/28/22 1033   ? ?  ? PT LONG TERM GOAL #1  ? Title Patient will demonstrate independent use of ongoing/advanced HEP to facilitate ability to maintain/progress functional gains from skilled physical therapy services   ? Status On-going   ? Target Date 03/31/22   ?  ? PT LONG TERM GOAL #2  ? Title Patient to report reduction in frequency and intensity of R hip  pain by >/= 50-75% to allow for improved activity tolerance   ? Status On-going   ? Target Date 03/31/22   ?  ? PT LONG TERM GOAL #3  ? Title Patient will demonstrate improved B LE strength to >/= 4 to 4+/5 for improved stability and ease of mobility   ? Status On-going   02/28/22- see flowsheet  ? Target Date 03/31/22   ?  ? PT LONG TERM GOAL #4  ? Title Patient will ambulate with normal gait pattern and good gait stability w/o AD   ? Status Partially Met   good stability and gait pattern shown with SPC  ? Target Date 03/31/22   ?  ? PT LONG TERM GOAL #5  ? Title Patient to report ability to perform ADLs, household, and leisure activities without limitation due to R hip pain, LOM or weakness   ? Status On-going   ? Target Date 03/31/22   ?  ? PT LONG TERM GOAL #6  ? Title Patient will improve hip FOTO to >/= 71 to demonstrate improving function   ? Status On-going   ? Target Date 03/31/22   ? ?  ?  ? ?  ? ? ? ? ? ? ? ? Plan - 03/03/22 1025   ? ? Clinical Impression Statement Kristina Vang reports MD pleased with her progress thus far. She denies pain today but notes increased muscle soreness from increased walking now that she has weaned to the [facility]. Continued to progress hip strengthening to improve activity tolerance, adding looped yellow TB to standing 3-way hip and introducing fwd and lateral step-ups - yellow TB provided for home use. Clarified movement pattern with squats, avoiding knees fwd of toes and worked on even weight shift with sit <> stand using mirror feedback for pt self-correction. Kristina Vang is progressing well with PT.  ? Comorbidities OA, back pain   ? Rehab Potential Excellent   ? PT Frequency 2x / week   ? PT Duration 6 weeks   ? PT Treatment/Interventions ADLs/Self Care Home Management;Cryotherapy;Electrical Stimulation;Moist Heat;Ultrasound;DME Instruction;Gait training;Stair training;Functional mobility training;Therapeutic activities;Therapeutic exercise;Balance training;Neuromuscular re-education;Manual  techniques;Scar mobilization;Passive range of motion;Dry needling;Taping   ? PT Next Visit Plan R hip ROM and B LE strengthening; gait training to normalize gait pattern and eventually wean from [facility]   ? PT Home Exercise Plan hospital issued HEP;  Access Code: [email or web address]   ? Consulted and Agree with Plan of Care Patient   ? ?  ?  ? ?  ? ? ?Patient will benefit from skilled therapeutic intervention in order to improve the following deficits and impairments:  Abnormal gait, Decreased activity tolerance, Decreased balance, Decreased endurance, Decreased mobility, Decreased range of motion, Decreased strength, Difficulty walking, Increased edema, Increased fascial restricitons, Increased muscle spasms, Impaired perceived functional ability, Impaired flexibility, Improper body mechanics, Postural dysfunction, Pain ? ?Visit Diagnosis: ?Pain in right hip ? ?Stiffness of right hip, not elsewhere classified ? ?Muscle weakness (generalized) ? ?Other abnormalities of gait and mobility ? ?Difficulty in walking, not elsewhere classified ? ? ? ? ?Problem List ?Patient Active Problem List  ? Diagnosis Date Noted  ? 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 ? ?Percival Spanish, PT ?03/03/2022, 12:12 PM ? ?[place] ?[facility] ?[address] ?[place], [place], [address] ?Phone: [phone]   Fax:  [phone] ? ?Name: Kristina Vang ?MRN: [id number] ?Date of Birth: 03-12-1951 ? ? ? ?

## 2022-03-07 ENCOUNTER — Ambulatory Visit: Payer: Medicare Other

## 2022-03-07 DIAGNOSIS — M25551 Pain in right hip: Secondary | ICD-10-CM

## 2022-03-07 DIAGNOSIS — R262 Difficulty in walking, not elsewhere classified: Secondary | ICD-10-CM

## 2022-03-07 DIAGNOSIS — M6281 Muscle weakness (generalized): Secondary | ICD-10-CM

## 2022-03-07 DIAGNOSIS — M25651 Stiffness of right hip, not elsewhere classified: Secondary | ICD-10-CM

## 2022-03-07 DIAGNOSIS — R2689 Other abnormalities of gait and mobility: Secondary | ICD-10-CM

## 2022-03-07 NOTE — Therapy (Signed)
[place] ?[facility] ?[address] ?[place], [place], [address] ?Phone: [phone]   Fax:  [phone] ? ?Physical Therapy Treatment ? ?Patient Details  ?Name: Kristina Vang ?MRN: [id number] ?Date of Birth: 10-21-1951 ?Referring Provider (PT): Edmonia Lynch, MD ? ? ?Encounter Date: 03/07/2022 ? ? PT End of Session - 03/07/22 1100   ? ? Visit Number 6   ? Number of Visits 12   ? Date for PT Re-Evaluation 03/31/22   ? Authorization Type BCBS Medicare   ? PT Start Time 1019   ? PT Stop Time 1058   ? PT Time Calculation (min) 39 min   ? Activity Tolerance Patient tolerated treatment well   ? Behavior During Therapy [facility] for tasks assessed/performed   ? ?  ?  ? ?  ? ? ?Past Medical History:  ?Diagnosis Date  ? Arthritis   ? Cancer [facility])   ? ? ?Past Surgical History:  ?Procedure Laterality Date  ? COLONOSCOPY    ? TOTAL HIP ARTHROPLASTY Right 02/15/2022  ? Procedure: TOTAL HIP ARTHROPLASTY ANTERIOR APPROACH;  Surgeon: Renette Butters, MD;  Location: [facility];  Service: Orthopedics;  Laterality: Right;  ? ? ?There were no vitals filed for this visit. ? ? Subjective Assessment - 03/07/22 1020   ? ? Subjective Pt feels ready to wean to from [facility], no pain.   ? Pertinent History R THA 02/15/22   ? Patient Stated Goals "walk comfortably w/o the walker or a cane w/o a limp"   ? Currently in Pain? No/denies   ? ?  ?  ? ?  ? ? ? ? ? ? ? ? ? ? ? ? ? ? ? ? ? ? ? ? [facility] Adult PT Treatment/Exercise - 03/07/22 0001   ? ?  ? Ambulation/Gait  ? Ambulation/Gait Yes   ? Ambulation Distance (Feet) 270 Feet   ? Assistive device None   ? Gait Comments pt demonstrates R hip compensation with R LE advancement   ?  ? Knee/Hip Exercises: Aerobic  ? Nustep L6 x 7 min  (UE/LE)   ?  ? Knee/Hip Exercises: Machines for Strengthening  ? Cybex Knee Extension 2 sets of 10 15#   ? Cybex Knee Flexion 20lb 2x10   ? Cybex Leg Press 20lb 20 reps   ?  ? Knee/Hip Exercises: Standing  ? Hip Flexion  Stengthening;Both;10 reps;Knee straight   ? Hip Flexion Limitations yellow TB at knees; UE support on RW   ? Hip Abduction Stengthening;Both;10 reps;Knee straight   ? Abduction Limitations looped yellow TB at knees, UE support on counter   ? Hip Extension Stengthening;Both;10 reps;Knee straight   ? Extension Limitations looped yellow TB at knees, UE support on counter   ? Lateral Step Up Right;2 sets;10 reps;Step Height: 6";Hand Hold: 2;Hand Hold: 1   ? Forward Step Up Right;2 sets;10 reps;Hand Hold: 2;Step Height: 6"   ?  ? Knee/Hip Exercises: Seated  ? Long Arc Quad AROM;Both;10 reps   ? Long [employer] Limitations with VMO ball squeeze   ? Ball Squeeze 3x10"   ? Sit to Sand without UE support;15 reps   slight staggered stance  ? ?  ?  ? ?  ? ? ? ? ? ? ? ? ? ? ? ? PT Short Term Goals - 02/28/22 1025   ? ?  ? PT SHORT TERM GOAL #1  ? Title Patient will be independent in initial HEP  to improve strength/mobility for better functional independence with ADLs   ? Status Achieved   02/28/22  ? Target Date 03/03/22   ? ?  ?  ? ?  ? ? ? ? PT Long Term Goals - 02/28/22 1033   ? ?  ? PT LONG TERM GOAL #1  ? Title Patient will demonstrate independent use of ongoing/advanced HEP to facilitate ability to maintain/progress functional gains from skilled physical therapy services   ? Status On-going   ? Target Date 03/31/22   ?  ? PT LONG TERM GOAL #2  ? Title Patient to report reduction in frequency and intensity of R hip pain by >/= 50-75% to allow for improved activity tolerance   ? Status On-going   ? Target Date 03/31/22   ?  ? PT LONG TERM GOAL #3  ? Title Patient will demonstrate improved B LE strength to >/= 4 to 4+/5 for improved stability and ease of mobility   ? Status On-going   02/28/22- see flowsheet  ? Target Date 03/31/22   ?  ? PT LONG TERM GOAL #4  ? Title Patient will ambulate with normal gait pattern and good gait stability w/o AD   ? Status Partially Met   good stability and gait pattern shown with SPC  ? Target  Date 03/31/22   ?  ? PT LONG TERM GOAL #5  ? Title Patient to report ability to perform ADLs, household, and leisure activities without limitation due to R hip pain, LOM or weakness   ? Status On-going   ? Target Date 03/31/22   ?  ? PT LONG TERM GOAL #6  ? Title Patient will improve hip FOTO to >/= 71 to demonstrate improving function   ? Status On-going   ? Target Date 03/31/22   ? ?  ?  ? ?  ? ? ? ? ? ? ? ? Plan - 03/07/22 1101   ? ? Clinical Impression Statement Pt responded well to treatment, some cues required with sidesteps and hip abd to avoid hip ER and rotating trunk. Reviewed standing hip exercises added to HEP last session and provided cues as needed with these exercises. Reviewed gait w/o AD, pt did show some L pelvic drop with R LE advancement but good stability, instructed to continue with use of SPC in public but wean while at home. Pt showed no limitations with the interventions today, keep progressing.   ? Personal Factors and Comorbidities Comorbidity 2   ? Comorbidities OA, back pain   ? PT Frequency 2x / week   ? PT Duration 6 weeks   ? PT Treatment/Interventions ADLs/Self Care Home Management;Cryotherapy;Electrical Stimulation;Moist Heat;Ultrasound;DME Instruction;Gait training;Stair training;Functional mobility training;Therapeutic activities;Therapeutic exercise;Balance training;Neuromuscular re-education;Manual techniques;Scar mobilization;Passive range of motion;Dry needling;Taping   ? PT Next Visit Plan R hip ROM and B LE strengthening; gait training to normalize gait pattern and eventually wean from [facility]   ? PT Home Exercise Plan hospital issued HEP;  Access Code: [email or web address]   ? Consulted and Agree with Plan of Care Patient   ? ?  ?  ? ?  ? ? ?Patient will benefit from skilled therapeutic intervention in order to improve the following deficits and impairments:  Abnormal gait, Decreased activity tolerance, Decreased balance, Decreased endurance, Decreased mobility, Decreased range of motion,  Decreased strength, Difficulty walking, Increased edema, Increased fascial restricitons, Increased muscle spasms, Impaired perceived functional ability, Impaired flexibility, Improper body mechanics, Postural dysfunction, Pain ? ?Visit Diagnosis: ?Pain in right hip ? ?  Stiffness of right hip, not elsewhere classified ? ?Muscle weakness (generalized) ? ?Other abnormalities of gait and mobility ? ?Difficulty in walking, not elsewhere classified ? ? ? ? ?Problem List ?Patient Active Problem List  ? Diagnosis Date Noted  ? 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 ? ?Artist Pais, PTA ?03/07/2022, 12:09 PM ? ?[place] ?[facility] ?[address] ?[place], [place], [address] ?Phone: [phone]   Fax:  [phone] ? ?Name: Kristina Vang ?MRN: [id number] ?Date of Birth: October 22, 1951 ? ? ? ?

## 2022-03-10 ENCOUNTER — Ambulatory Visit: Payer: Medicare Other

## 2022-03-10 DIAGNOSIS — R262 Difficulty in walking, not elsewhere classified: Secondary | ICD-10-CM

## 2022-03-10 DIAGNOSIS — R2689 Other abnormalities of gait and mobility: Secondary | ICD-10-CM

## 2022-03-10 DIAGNOSIS — M25551 Pain in right hip: Secondary | ICD-10-CM | POA: Diagnosis not present

## 2022-03-10 DIAGNOSIS — M6281 Muscle weakness (generalized): Secondary | ICD-10-CM

## 2022-03-10 DIAGNOSIS — M25651 Stiffness of right hip, not elsewhere classified: Secondary | ICD-10-CM

## 2022-03-10 NOTE — Therapy (Signed)
[place] [facility] ?[place] ?[place], [place], [address] ?Phone: [phone]   Fax:  [phone] ? ?Physical Therapy Treatment ? ?Patient Details  ?Name: Kristina Vang ?MRN: [id number] ?Date of Birth: [birth year] ?Referring Provider (PT): Edmonia Lynch, MD ? ? ?Encounter Date: 03/10/2022 ? ? PT End of Session - 03/10/22 1101   ? ? Visit Number 7   ? Number of Visits 12   ? Date for PT Re-Evaluation 03/31/22   ? Authorization Type BCBS Medicare   ? PT Start Time 1017   ? PT Stop Time 1059   ? PT Time Calculation (min) 42 min   ? Activity Tolerance Patient tolerated treatment well   ? Behavior During Therapy [facility] for tasks assessed/performed   ? ?  ?  ? ?  ? ? ?Past Medical History:  ?Diagnosis Date  ? Arthritis   ? Cancer [facility])   ? ? ?Past Surgical History:  ?Procedure Laterality Date  ? COLONOSCOPY    ? TOTAL HIP ARTHROPLASTY Right 02/15/2022  ? Procedure: TOTAL HIP ARTHROPLASTY ANTERIOR APPROACH;  Surgeon: Renette Butters, MD;  Location: [facility];  Service: Orthopedics;  Laterality: Right;  ? ? ?There were no vitals filed for this visit. ? ? Subjective Assessment - 03/10/22 1020   ? ? Subjective Pt reports doing well today.   ? Pertinent History R THA 02/15/22   ? Patient Stated Goals "walk comfortably w/o the walker or a cane w/o a limp"   ? Currently in Pain? No/denies   ? ?  ?  ? ?  ? ? ? ? ? ? ? ? ? ? ? ? ? ? ? ? ? ? ? ? [place] Adult PT Treatment/Exercise - 03/10/22 0001   ? ?  ? Knee/Hip Exercises: Stretches  ? Other Knee/Hip Stretches R SKTC 2x30"   ?  ? Knee/Hip Exercises: Aerobic  ? Nustep L6 x 6 min  (UE/LE)   ?  ? Knee/Hip Exercises: Standing  ? Hip Abduction Stengthening;Both;15 reps;Knee straight   ? Abduction Limitations looped yellow TB at knees, UE support on counter   ? Forward Step Up Right;20 reps;Hand Hold: 0;Step Height: 6"   ? Step Down 20 reps;Hand Hold: 0;Step Height: 4";Left   lateral step down  ? Other Standing Knee Exercises  sidesteps with yellow TB at knees no UE support 3x along counter   ?  ? Knee/Hip Exercises: Supine  ? Bridges Strengthening;Both;10 reps   ? Bridges Limitations yellow TB   ? Other Supine Knee/Hip Exercises clams with yellow TB and manual resistance given by therapist 10x3"   ? ?  ?  ? ?  ? ? ? ? ? ? ? ? ? ? ? ? PT Short Term Goals - 02/28/22 1025   ? ?  ? PT SHORT TERM GOAL #1  ? Title Patient will be independent in initial HEP to improve strength/mobility for better functional independence with ADLs   ? Status Achieved   02/28/22  ? Target Date 03/03/22   ? ?  ?  ? ?  ? ? ? ? PT Long Term Goals - 03/10/22 1101   ? ?  ? PT LONG TERM GOAL #1  ? Title Patient will demonstrate independent use of ongoing/advanced HEP to facilitate ability to maintain/progress functional gains from skilled physical therapy services   ? Status On-going   ? Target Date 03/31/22   ?  ? PT LONG TERM GOAL #2  ?  Title Patient to report reduction in frequency and intensity of R hip pain by >/= 50-75% to allow for improved activity tolerance   ? Status Partially Met   overall pt notes resolution of pain but only minimal pain with transition from sit to stand  ? Target Date 03/31/22   ?  ? PT LONG TERM GOAL #3  ? Title Patient will demonstrate improved B LE strength to >/= 4 to 4+/5 for improved stability and ease of mobility   ? Status On-going   02/28/22- see flowsheet  ? Target Date 03/31/22   ?  ? PT LONG TERM GOAL #4  ? Title Patient will ambulate with normal gait pattern and good gait stability w/o AD   ? Status Partially Met   good stability and gait pattern shown with SPC  ? Target Date 03/31/22   ?  ? PT LONG TERM GOAL #5  ? Title Patient to report ability to perform ADLs, household, and leisure activities without limitation due to R hip pain, LOM or weakness   ? Status On-going   ? Target Date 03/31/22   ?  ? PT LONG TERM GOAL #6  ? Title Patient will improve hip FOTO to >/= 71 to demonstrate improving function   ? Status On-going   ?  Target Date 03/31/22   ? ?  ?  ? ?  ? ? ? ? ? ? ? ? Plan - 03/10/22 1103   ? ? Clinical Impression Statement Pt responded well to treatment but reports difficulty with donning/doffing shoes. She does report close to full resolution of pain but mild pain with STS transfers - LTG #2 partially met now. Still noticing pelvic drop with gait w/o AD so continued working on glute med strengthening, pt noted some fatigue in the glutes after session, finished with SKTC stretching.  Pt is progressing well.   ? Personal Factors and Comorbidities Comorbidity 2   ? Comorbidities OA, back pain   ? PT Frequency 2x / week   ? PT Duration 6 weeks   ? PT Treatment/Interventions ADLs/Self Care Home Management;Cryotherapy;Electrical Stimulation;Moist Heat;Ultrasound;DME Instruction;Gait training;Stair training;Functional mobility training;Therapeutic activities;Therapeutic exercise;Balance training;Neuromuscular re-education;Manual techniques;Scar mobilization;Passive range of motion;Dry needling;Taping   ? PT Next Visit Plan R hip ROM and B LE strengthening; gait training to normalize gait pattern and eventually wean from [facility]   ? PT Home Exercise Plan hospital issued HEP;  Access Code: [email or web address]   ? Consulted and Agree with Plan of Care Patient   ? ?  ?  ? ?  ? ? ?Patient will benefit from skilled therapeutic intervention in order to improve the following deficits and impairments:  Abnormal gait, Decreased activity tolerance, Decreased balance, Decreased endurance, Decreased mobility, Decreased range of motion, Decreased strength, Difficulty walking, Increased edema, Increased fascial restricitons, Increased muscle spasms, Impaired perceived functional ability, Impaired flexibility, Improper body mechanics, Postural dysfunction, Pain ? ?Visit Diagnosis: ?Pain in right hip ? ?Stiffness of right hip, not elsewhere classified ? ?Muscle weakness (generalized) ? ?Other abnormalities of gait and mobility ? ?Difficulty in walking, not  elsewhere classified ? ? ? ? ?Problem List ?Patient Active Problem List  ? Diagnosis Date Noted  ? 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 ? ?Artist Pais, PTA ?03/10/2022, 12:01 PM ? ?[place] ?[facility] ?[place] ?[place], [place], [address] ?Phone: [phone]   Fax:  [phone] ? ?Name: Candyce Gambino Mosey ?MRN: [id number] ?Date of Birth: 06-10-1951 ? ? ? ?

## 2022-03-14 ENCOUNTER — Ambulatory Visit: Payer: Medicare Other

## 2022-03-14 DIAGNOSIS — R262 Difficulty in walking, not elsewhere classified: Secondary | ICD-10-CM

## 2022-03-14 DIAGNOSIS — M25551 Pain in right hip: Secondary | ICD-10-CM | POA: Diagnosis not present

## 2022-03-14 DIAGNOSIS — R2689 Other abnormalities of gait and mobility: Secondary | ICD-10-CM

## 2022-03-14 DIAGNOSIS — M25651 Stiffness of right hip, not elsewhere classified: Secondary | ICD-10-CM

## 2022-03-14 DIAGNOSIS — M6281 Muscle weakness (generalized): Secondary | ICD-10-CM

## 2022-03-14 NOTE — Therapy (Signed)
[place] ?[facility] ?[place] ?[place], [place], [address] ?Phone: [phone]   Fax:  [phone] ? ?Physical Therapy Treatment ? ?Patient Details  ?Name: Kristina Vang ?MRN: [id number] ?Date of Birth: 03-08-1951 ?Referring Provider (PT): Edmonia Lynch, MD ? ? ?Encounter Date: 03/14/2022 ? ? PT End of Session - 03/14/22 1059   ? ? Visit Number 8   ? Number of Visits 12   ? Date for PT Re-Evaluation 03/31/22   ? Authorization Type BCBS Medicare   ? PT Start Time 1021   pt late  ? PT Stop Time 1059   ? PT Time Calculation (min) 38 min   ? Activity Tolerance Patient tolerated treatment well   ? Behavior During Therapy [facility] for tasks assessed/performed   ? ?  ?  ? ?  ? ? ?Past Medical History:  ?Diagnosis Date  ? Arthritis   ? Cancer [facility])   ? ? ?Past Surgical History:  ?Procedure Laterality Date  ? COLONOSCOPY    ? TOTAL HIP ARTHROPLASTY Right 02/15/2022  ? Procedure: TOTAL HIP ARTHROPLASTY ANTERIOR APPROACH;  Surgeon: Renette Butters, MD;  Location: [facility];  Service: Orthopedics;  Laterality: Right;  ? ? ?There were no vitals filed for this visit. ? ? Subjective Assessment - 03/14/22 1024   ? ? Subjective Pt notes some L knee pain over the weekend from the way she has been sleeping and exercises, reports having knee OA.   ? Pertinent History R THA 02/15/22   ? Patient Stated Goals "walk comfortably w/o the walker or a cane w/o a limp"   ? Currently in Pain? No/denies   ? ?  ?  ? ?  ? ? ? ? ? ? ? ? ? ? ? ? ? ? ? ? ? ? ? ? [place] Adult PT Treatment/Exercise - 03/14/22 0001   ? ?  ? Knee/Hip Exercises: Aerobic  ? Recumbent Bike L1x15mn   ?  ? Knee/Hip Exercises: Machines for Strengthening  ? Cybex Knee Extension 15lb x 10 BLE, 5lb x 10 RLE   ? Cybex Knee Flexion 20lb 2x10 BLE, 10lb x 10 RLE   ?  ? Knee/Hip Exercises: Standing  ? Hip Flexion Stengthening;Both;10 reps;Knee straight   ? Hip Flexion Limitations red TB at knees; UE support on RW   ? Hip Abduction  Stengthening;Both;Knee straight;10 reps   ? Abduction Limitations red yellow TB at knees, UE support on counter   ? Hip Extension Stengthening;Both;10 reps;Knee straight   ? Extension Limitations looped red TB at knees, UE support on counter   ? Lateral Step Up Right;20 reps;Hand Hold: 0;Step Height: 6"   L LE toe tap onto step  ? Forward Step Up Right;20 reps;Hand Hold: 0;Step Height: 6"   intermittent UE support; L LE toe tap onto step  ?  ? Knee/Hip Exercises: Seated  ? Long ACS[employer] CorporationStrengthening;Both;10 reps;Weights   ? Long Arc Quad Weight 3 lbs.   ? Long ACSX CorporationLimitations with slight ER to target VMO   ? Other Seated Knee/Hip Exercises SLR with 3# x 10 each   ? ?  ?  ? ?  ? ? ? ? ? ? ? ? ? ? ? ? PT Short Term Goals - 02/28/22 1025   ? ?  ? PT SHORT TERM GOAL #1  ? Title Patient will be independent in initial HEP to improve strength/mobility for better functional independence with ADLs   ?  Status Achieved   02/28/22  ? Target Date 03/03/22   ? ?  ?  ? ?  ? ? ? ? PT Long Term Goals - 03/14/22 1125   ? ?  ? PT LONG TERM GOAL #1  ? Title Patient will demonstrate independent use of ongoing/advanced HEP to facilitate ability to maintain/progress functional gains from skilled physical therapy services   ? Status On-going   ? Target Date 03/31/22   ?  ? PT LONG TERM GOAL #2  ? Title Patient to report reduction in frequency and intensity of R hip pain by >/= 50-75% to allow for improved activity tolerance   ? Status Partially Met   03/10/22- overall pt notes resolution of pain but only minimal pain with transition from sit to stand  ? Target Date 03/31/22   ?  ? PT LONG TERM GOAL #3  ? Title Patient will demonstrate improved B LE strength to >/= 4 to 4+/5 for improved stability and ease of mobility   ? Status On-going   02/28/22- see flowsheet  ? Target Date 03/31/22   ?  ? PT LONG TERM GOAL #4  ? Title Patient will ambulate with normal gait pattern and good gait stability w/o AD   ? Status Partially Met   03/07/22- good  stability and gait pattern shown with SPC  ? Target Date 03/31/22   ?  ? PT LONG TERM GOAL #5  ? Title Patient to report ability to perform ADLs, household, and leisure activities without limitation due to R hip pain, LOM or weakness   ? Status On-going   ? Target Date 03/31/22   ?  ? PT LONG TERM GOAL #6  ? Title Patient will improve hip FOTO to >/= 71 to demonstrate improving function   ? Status On-going   ? Target Date 03/31/22   ? ?  ?  ? ?  ? ? ? ? ? ? ? ? Plan - 03/14/22 1100   ? ? Clinical Impression Statement Pt was able to progress through the interventions today w/o any subjective reports of pain. She did become fatigued with the step ups today. Cues required with exercises for upright posture and correct movement patterns. Continue progressing hip strengthening.   ? Personal Factors and Comorbidities Comorbidity 2   ? Comorbidities OA, back pain   ? PT Frequency 2x / week   ? PT Duration 6 weeks   ? PT Treatment/Interventions ADLs/Self Care Home Management;Cryotherapy;Electrical Stimulation;Moist Heat;Ultrasound;DME Instruction;Gait training;Stair training;Functional mobility training;Therapeutic activities;Therapeutic exercise;Balance training;Neuromuscular re-education;Manual techniques;Scar mobilization;Passive range of motion;Dry needling;Taping   ? PT Next Visit Plan R hip ROM and B LE strengthening; gait training to wean from [facility]   ? PT Home Exercise Plan hospital issued HEP;  Access Code: [email or web address]   ? Consulted and Agree with Plan of Care Patient   ? ?  ?  ? ?  ? ? ?Patient will benefit from skilled therapeutic intervention in order to improve the following deficits and impairments:  Abnormal gait, Decreased activity tolerance, Decreased balance, Decreased endurance, Decreased mobility, Decreased range of motion, Decreased strength, Difficulty walking, Increased edema, Increased fascial restricitons, Increased muscle spasms, Impaired perceived functional ability, Impaired flexibility, Improper body  mechanics, Postural dysfunction, Pain ? ?Visit Diagnosis: ?Pain in right hip ? ?Stiffness of right hip, not elsewhere classified ? ?Muscle weakness (generalized) ? ?Other abnormalities of gait and mobility ? ?Difficulty in walking, not elsewhere classified ? ? ? ? ?Problem List ?Patient Active Problem List  ?  Diagnosis Date Noted  ? 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 ? ?Artist Pais, PTA ?03/14/2022, 11:29 AM ? ?[place] ?[facility] ?[place] ?[place], [place], [address] ?Phone: [phone]   Fax:  [phone] ? ?Name: Kristina Vang ?MRN: [id number] ?Date of Birth: 1951/06/04 ? ? ? ?

## 2022-03-17 ENCOUNTER — Encounter: Payer: Medicare Other | Admitting: Physical Therapy

## 2022-03-18 ENCOUNTER — Ambulatory Visit: Payer: Medicare Other | Admitting: Physical Therapy

## 2022-03-18 ENCOUNTER — Encounter: Payer: Self-pay | Admitting: Physical Therapy

## 2022-03-18 DIAGNOSIS — R262 Difficulty in walking, not elsewhere classified: Secondary | ICD-10-CM

## 2022-03-18 DIAGNOSIS — M25551 Pain in right hip: Secondary | ICD-10-CM

## 2022-03-18 DIAGNOSIS — M6281 Muscle weakness (generalized): Secondary | ICD-10-CM

## 2022-03-18 DIAGNOSIS — R2689 Other abnormalities of gait and mobility: Secondary | ICD-10-CM

## 2022-03-18 DIAGNOSIS — M25651 Stiffness of right hip, not elsewhere classified: Secondary | ICD-10-CM

## 2022-03-18 NOTE — Patient Instructions (Signed)
? ? ?  Access Code: [email or web address] ?URL: [email or web address] ?Date: 03/18/2022 ?Prepared by: Annie Paras ? ?Exercises ?- Standing 3-way Hip with Walker  - 1 x daily - 7 x weekly - 2 sets - 10 reps - 3 sec hold ?- Mini Squat with Counter Support  - 1 x daily - 4-5 x weekly - 2 sets - 10 reps - 3-5 sec hold ?- Heel Toe Raises with Counter Support  - 1 x daily - 4-5 x weekly - 2 sets - 10 reps - 3 sec hold ?- Seated Hip Adduction Squeeze with Ball  - 1 x daily - 4-5 x weekly - 2 sets - 10 reps - 3-5 sec hold ?- Sit to Stand with Resistance Around Legs  - 1 x daily - 3-4 x weekly - 2 sets - 10 reps ?- Clamshell with Resistance  - 1 x daily - 3-4 x weekly - 2 sets - 10 reps - 3-5 sec hold ?- Standing Terminal Knee Extension with Resistance  - 1 x daily - 3-4 x weekly - 2 sets - 10 reps - 5 sec hold ?

## 2022-03-18 NOTE — Therapy (Signed)
[facility] ?[facility] ?[place] ?[place], [place], [address] ?Phone: [phone]   Fax:  [phone] ? ?Physical Therapy Treatment / Progress Note ? ?Patient Details  ?Name: Kristina Vang ?MRN: [id number] ?Date of Birth: 08/14/1951 ?Referring Provider (PT): Edmonia Lynch, MD ? ?Progress Note ? ?Reporting Period 02/17/2022 to 03/18/2022 ? ?See note below for Objective Data and Assessment of Progress/Goals.  ? ? ? ?Encounter Date: 03/18/2022 ? ? PT End of Session - 03/18/22 3704   ? ? Visit Number 9   ? Number of Visits 12   ? Date for PT Re-Evaluation 03/31/22   ? Authorization Type BCBS Medicare   ? Progress Note Due on Visit 12   PN completed on visit #9 - 03/18/22  ? PT Start Time [phone]   ? PT Stop Time 1019   ? PT Time Calculation (min) 51 min   ? Activity Tolerance Patient tolerated treatment well   ? Behavior During Therapy [facility] for tasks assessed/performed   ? ?  ?  ? ?  ? ? ?Past Medical History:  ?Diagnosis Date  ? Arthritis   ? Cancer [facility])   ? ? ?Past Surgical History:  ?Procedure Laterality Date  ? COLONOSCOPY    ? TOTAL HIP ARTHROPLASTY Right 02/15/2022  ? Procedure: TOTAL HIP ARTHROPLASTY ANTERIOR APPROACH;  Surgeon: Renette Butters, MD;  Location: [facility];  Service: Orthopedics;  Laterality: Right;  ? ? ?There were no vitals filed for this visit. ? ? Subjective Assessment - 03/18/22 0930   ? ? Subjective Pt reports ongoing knee pain as she has been becoming more active. Pt reports she hasn't been using her cane as much, but notes her knee pain is better when she uses the cane.   ? Pertinent History R THA 02/15/22   ? Patient Stated Goals "walk comfortably w/o the walker or a cane w/o a limp"   ? Currently in Pain? Yes   ? Pain Score 0-No pain   ? Pain Location Hip   ? Pain Orientation Right   ? Pain Score 0   2/10 when hurting  ? Pain Location Knee   ? Pain Orientation Right   ? Pain Descriptors / Indicators Aching   ? Pain Type Acute pain   ? Pain  Onset 1 to 4 weeks ago   ? Pain Frequency Intermittent   ? Aggravating Factors  increased walking, esp w/o cane; positioning for hip rotation stretches   ? Pain Relieving Factors Tylenol   ? ?  ?  ? ?  ? ? ? ? ? [facility] PT Assessment - 03/18/22 0928   ? ?  ? Assessment  ? Medical Diagnosis R THA - anterior approach   ? Referring Provider (PT) Edmonia Lynch, MD   ? Onset Date/Surgical Date 02/15/22   ? Next MD Visit 03/28/22   ?  ? Observation/Other Assessments  ? Focus on Therapeutic Outcomes (FOTO)  Hip: 67, predicted D/C FS=71   ?  ? Strength  ? Right Hip Flexion 4/5   ? Right Hip Extension 4/5   ? Right Hip External Rotation  4-/5   ? Right Hip Internal Rotation 4+/5   ? Right Hip ABduction 4/5   ? Right Hip ADduction 4+/5   tested in sitting due to discomfort in R side lying  ? Left Hip Flexion 4+/5   ? Left Hip Extension 4+/5   ? Left Hip External Rotation 4+/5   ?  Left Hip Internal Rotation 4+/5   ? Left Hip ABduction 4+/5   ? Left Hip ADduction 4+/5   tested in sitting due to discomfort in R side lying  ? Right Knee Flexion 4+/5   ? Right Knee Extension 4/5   ? Left Knee Flexion 4+/5   ? Left Knee Extension 4+/5   ? Right Ankle Dorsiflexion 4+/5   ? Left Ankle Dorsiflexion 4+/5   ? ?  ?  ? ?  ? ? ? ? ? ? ? ? ? ? ? ? ? ? ? ? [place] Adult PT Treatment/Exercise - 03/18/22 5248   ? ?  ? Knee/Hip Exercises: Aerobic  ? Nustep L6 x 6 min  (UE/LE)   ?  ? Knee/Hip Exercises: Standing  ? Terminal Knee Extension Right;20 reps;Strengthening;Theraband   ? Other Standing Knee Exercises B side stepping & fwd/back monster walks with looped red TB at ankles 2 x 8f   ?  ? Knee/Hip Exercises: Seated  ? Sit to Sand 10 reps;without UE support   slight staggered stance + red TB at knees  ?  ? Knee/Hip Exercises: Supine  ? Bridges with Clamshell Both;10 reps;Strengthening   red TB  ?  ? Knee/Hip Exercises: Sidelying  ? Clams R red TB clam 10 x 3", 2 sets   ? ?  ?  ? ?  ? ? ? ? ? ? ? ? ? ? PT Education - 03/18/22 1022   ? ? Education  Details HEP update   ? Person(s) Educated Patient   ? Methods Explanation;Demonstration;Verbal cues;Handout   ? Comprehension Verbalized understanding;Verbal cues required;Returned demonstration;Need further instruction   ? ?  ?  ? ?  ? ? ? PT Short Term Goals - 02/28/22 1025   ? ?  ? PT SHORT TERM GOAL #1  ? Title Patient will be independent in initial HEP to improve strength/mobility for better functional independence with ADLs   ? Status Achieved   02/28/22  ? Target Date 03/03/22   ? ?  ?  ? ?  ? ? ? ? PT Long Term Goals - 03/18/22 0936   ? ?  ? PT LONG TERM GOAL #1  ? Title Patient will demonstrate independent use of ongoing/advanced HEP to facilitate ability to maintain/progress functional gains from skilled physical therapy services   ? Status Partially Met   ? Target Date 03/31/22   ?  ? PT LONG TERM GOAL #2  ? Title Patient to report reduction in frequency and intensity of R hip pain by >/= 50-75% to allow for improved activity tolerance   ? Status Achieved   03/18/22- overall pt notes resolution of R hip pain  ? Target Date 03/31/22   ?  ? PT LONG TERM GOAL #3  ? Title Patient will demonstrate improved B LE strength to >/= 4 to 4+/5 for improved stability and ease of mobility   ? Status Partially Met   03/18/22 - R LE strength improving with only hip ER 4-/5  ? Target Date 03/31/22   ?  ? PT LONG TERM GOAL #4  ? Title Patient will ambulate with normal gait pattern and good gait stability w/o AD   ? Status Achieved   03/18/22  ? Target Date 03/31/22   ?  ? PT LONG TERM GOAL #5  ? Title Patient to report ability to perform ADLs, household, and leisure activities without limitation due to R hip pain, LOM or weakness   ? Status  Partially Met   ? Target Date 03/31/22   ?  ? PT LONG TERM GOAL #6  ? Title Patient will improve hip FOTO to >/= 71 to demonstrate improving function   ? Baseline 35 (02/17/22); 67 (03/18/22)   ? Status Partially Met   ? Target Date 03/31/22   ? ?  ?  ? ?  ? ? ? ? ? ? ? ? Plan - 03/18/22 1019    ? ? Clinical Impression Statement Kristina Vang reports she has mostly weaned from the [facility] using it only when she goes out and will be walking a lot. Gait pattern w/o cane WNL and pt able to navigate stairs reciprocally with good step pattern (LTG #4 met) but increased effort reported for R LE. She reports R hip pain essentially resolved (LTG #2 met) but notes some increased R knee pain recently as she has been increasing her activity level. Strength testing revealed continued R hip weakness as well as R quad weakness which is likely contributing to her R knee pain. TE targeting continued hip strengthening with addition of quad strengthening with pt noting knee pain less by end of session - HEP updated to reflect exercise progression. Raylyn is progressing well with PT with all LTGs now at least partially met with previously mentioned goals already met. She will likely be ready to transition to her HEP as of the end of the current episode unless the R knee pain continues to be an issue.   ? Comorbidities OA, back pain   ? PT Frequency 2x / week   ? PT Duration 6 weeks   ? PT Treatment/Interventions ADLs/Self Care Home Management;Cryotherapy;Electrical Stimulation;Moist Heat;Ultrasound;DME Instruction;Gait training;Stair training;Functional mobility training;Therapeutic activities;Therapeutic exercise;Balance training;Neuromuscular re-education;Manual techniques;Scar mobilization;Passive range of motion;Dry needling;Taping   ? PT Next Visit Plan R hip ROM and B LE strengthening - emphasis on R hip and knee   ? PT Home Exercise Plan hospital issued HEP;  Access Code: [email or web address]   ? Consulted and Agree with Plan of Care Patient   ? ?  ?  ? ?  ? ? ?Patient will benefit from skilled therapeutic intervention in order to improve the following deficits and impairments:  Abnormal gait, Decreased activity tolerance, Decreased balance, Decreased endurance, Decreased mobility, Decreased range of motion, Decreased strength, Difficulty  walking, Increased edema, Increased fascial restricitons, Increased muscle spasms, Impaired perceived functional ability, Impaired flexibility, Improper body mechanics, Postural dysfunction, Pain ? ?Visi

## 2022-03-21 ENCOUNTER — Ambulatory Visit: Payer: Medicare Other

## 2022-03-21 DIAGNOSIS — R2689 Other abnormalities of gait and mobility: Secondary | ICD-10-CM

## 2022-03-21 DIAGNOSIS — M6281 Muscle weakness (generalized): Secondary | ICD-10-CM

## 2022-03-21 DIAGNOSIS — M25551 Pain in right hip: Secondary | ICD-10-CM

## 2022-03-21 DIAGNOSIS — M25651 Stiffness of right hip, not elsewhere classified: Secondary | ICD-10-CM

## 2022-03-21 DIAGNOSIS — R262 Difficulty in walking, not elsewhere classified: Secondary | ICD-10-CM

## 2022-03-21 NOTE — Therapy (Signed)
[place] ?[facility] ?[address] ?[place], [place], [address] ?Phone: [phone]   Fax:  [phone] ? ?Physical Therapy Treatment ? ?Patient Details  ?Name: Kristina Vang ?MRN: [id number] ?Date of Birth: Sep 17, 1951 ?Referring Provider (PT): Edmonia Lynch, MD ? ? ?Encounter Date: 03/21/2022 ? ? PT End of Session - 03/21/22 1100   ? ? Visit Number 10   ? Number of Visits 12   ? Date for PT Re-Evaluation 03/31/22   ? Authorization Type BCBS Medicare   ? Progress Note Due on Visit 12   PN completed on visit #9 - 03/18/22  ? PT Start Time 1017   ? PT Stop Time 1058   ? PT Time Calculation (min) 41 min   ? Activity Tolerance Patient tolerated treatment well   ? Behavior During Therapy [facility] for tasks assessed/performed   ? ?  ?  ? ?  ? ? ?Past Medical History:  ?Diagnosis Date  ? Arthritis   ? Cancer [facility])   ? ? ?Past Surgical History:  ?Procedure Laterality Date  ? COLONOSCOPY    ? TOTAL HIP ARTHROPLASTY Right 02/15/2022  ? Procedure: TOTAL HIP ARTHROPLASTY ANTERIOR APPROACH;  Surgeon: Renette Butters, MD;  Location: [facility];  Service: Orthopedics;  Laterality: Right;  ? ? ?There were no vitals filed for this visit. ? ? Subjective Assessment - 03/21/22 1020   ? ? Subjective Pt reports just a little knee pain today.   ? Pertinent History R THA 02/15/22   ? Patient Stated Goals "walk comfortably w/o the walker or a cane w/o a limp"   ? Currently in Pain? Yes   ? Pain Score 1    ? Pain Location Knee   ? Pain Orientation Right   ? Pain Descriptors / Indicators Sore;Aching   ? ?  ?  ? ?  ? ? ? ? ? ? ? ? ? ? ? ? ? ? ? ? ? ? ? ? [facility] PT Treatment/Exercise - 03/21/22 0001   ? ?  ? Knee/Hip Exercises: Aerobic  ? Nustep L5 x 6 min  (UE/LE)   ?  ? Knee/Hip Exercises: Standing  ? Other Standing Knee Exercises B side stepping & fwd/back monster walks with looped red TB at ankles 4 x 27f   ?  ? Knee/Hip Exercises: Seated  ? Hamstring Curl Strengthening;Right;10 reps;2  sets   ? Hamstring Limitations GTB   ? Sit to [place]--   reviewed  ?  ? Knee/Hip Exercises: Supine  ? Bridges with Clamshell Both;10 reps;Strengthening   red TB; 2 sets  ? Straight Leg Raises Strengthening;Right;10 reps   ?  ? Knee/Hip Exercises: Sidelying  ? Hip ABduction AROM;Right;10 reps   ? Clams reviewed   ? ?  ?  ? ?  ? ? ? ? ? ? ? ? ? ? ? ? PT Short Term Goals - 02/28/22 1025   ? ?  ? PT SHORT TERM GOAL #1  ? Title Patient will be independent in initial HEP to improve strength/mobility for better functional independence with ADLs   ? Status Achieved   02/28/22  ? Target Date 03/03/22   ? ?  ?  ? ?  ? ? ? ? PT Long Term Goals - 03/18/22 0936   ? ?  ? PT LONG TERM GOAL #1  ? Title Patient will demonstrate independent use of ongoing/advanced HEP to facilitate ability to maintain/progress functional gains from  skilled physical therapy services   ? Status Partially Met   ? Target Date 03/31/22   ?  ? PT LONG TERM GOAL #2  ? Title Patient to report reduction in frequency and intensity of R hip pain by >/= 50-75% to allow for improved activity tolerance   ? Status Achieved   03/18/22- overall pt notes resolution of R hip pain  ? Target Date 03/31/22   ?  ? PT LONG TERM GOAL #3  ? Title Patient will demonstrate improved B LE strength to >/= 4 to 4+/5 for improved stability and ease of mobility   ? Status Partially Met   03/18/22 - R LE strength improving with only hip ER 4-/5  ? Target Date 03/31/22   ?  ? PT LONG TERM GOAL #4  ? Title Patient will ambulate with normal gait pattern and good gait stability w/o AD   ? Status Achieved   03/18/22  ? Target Date 03/31/22   ?  ? PT LONG TERM GOAL #5  ? Title Patient to report ability to perform ADLs, household, and leisure activities without limitation due to R hip pain, LOM or weakness   ? Status Partially Met   ? Target Date 03/31/22   ?  ? PT LONG TERM GOAL #6  ? Title Patient will improve hip FOTO to >/= 71 to demonstrate improving function   ? Baseline 35 (02/17/22); 67  (03/18/22)   ? Status Partially Met   ? Target Date 03/31/22   ? ?  ?  ? ?  ? ? ? ? ? ? ? ? Plan - 03/21/22 1100   ? ? Clinical Impression Statement Pt continues being able to progress with exercises  w/o increased pain. She did report less knee pain today. Reviewed new exercises briefly to ensure understanding. Cues needed with monster walks with R LE to avoid twisting with trunk. Cuing with SLR needed for QS to emphasize quads. Otherwise pt w/o any limitations during interventions. Pt responded well to treatment.   ? Personal Factors and Comorbidities Comorbidity 2   ? Comorbidities OA, back pain   ? PT Frequency 2x / week   ? PT Duration 6 weeks   ? PT Treatment/Interventions ADLs/Self Care Home Management;Cryotherapy;Electrical Stimulation;Moist Heat;Ultrasound;DME Instruction;Gait training;Stair training;Functional mobility training;Therapeutic activities;Therapeutic exercise;Balance training;Neuromuscular re-education;Manual techniques;Scar mobilization;Passive range of motion;Dry needling;Taping   ? PT Next Visit Plan R hip ROM and B LE strengthening - emphasis on R hip and knee   ? PT Home Exercise Plan hospital issued HEP;  Access Code: [email or web address]   ? Consulted and Agree with Plan of Care Patient   ? ?  ?  ? ?  ? ? ?Patient will benefit from skilled therapeutic intervention in order to improve the following deficits and impairments:  Abnormal gait, Decreased activity tolerance, Decreased balance, Decreased endurance, Decreased mobility, Decreased range of motion, Decreased strength, Difficulty walking, Increased edema, Increased fascial restricitons, Increased muscle spasms, Impaired perceived functional ability, Impaired flexibility, Improper body mechanics, Postural dysfunction, Pain ? ?Visit Diagnosis: ?Pain in right hip ? ?Stiffness of right hip, not elsewhere classified ? ?Muscle weakness (generalized) ? ?Other abnormalities of gait and mobility ? ?Difficulty in walking, not elsewhere  classified ? ? ? ? ?Problem List ?Patient Active Problem List  ? Diagnosis Date Noted  ? 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 ? ?Artist Pais, PTA ?03/21/2022, 12:06 PM ? ?[place] ?[facility] ?[address] ?  [place], [place], [address] ?Phone: [phone]   Fax:  [phone] ? ?Name: Kristina Vang ?MRN: [id number] ?Date of Birth: 28-Jul-1951 ? ? ? ?

## 2022-03-24 ENCOUNTER — Ambulatory Visit: Payer: Medicare Other

## 2022-03-28 ENCOUNTER — Ambulatory Visit: Payer: Medicare Other | Attending: Orthopedic Surgery

## 2022-03-28 DIAGNOSIS — R2689 Other abnormalities of gait and mobility: Secondary | ICD-10-CM

## 2022-03-28 DIAGNOSIS — R262 Difficulty in walking, not elsewhere classified: Secondary | ICD-10-CM | POA: Diagnosis present

## 2022-03-28 DIAGNOSIS — M25551 Pain in right hip: Secondary | ICD-10-CM

## 2022-03-28 DIAGNOSIS — M25651 Stiffness of right hip, not elsewhere classified: Secondary | ICD-10-CM

## 2022-03-28 DIAGNOSIS — M6281 Muscle weakness (generalized): Secondary | ICD-10-CM | POA: Diagnosis present

## 2022-03-28 NOTE — Therapy (Addendum)
[place] ?[facility] ?[address] ?[place], [place], [address] ?Phone: [phone]   Fax:  [phone] ? ?Physical Therapy Treatment / Recertification ? ?Patient Details  ?Name: Kristina Vang ?MRN: [id number] ?Date of Birth: Mar 17, 1951 ?Referring Provider (PT): Edmonia Lynch, MD ? ? ?Encounter Date: 03/28/2022 ? ? PT End of Session - 03/28/22 1058   ? ? Visit Number 11   ? Number of Visits 18   ? Date for PT Re-Evaluation 05/09/22   ? Authorization Type BCBS Medicare   ? Progress Note Due on Visit 18   PN completed on visit #9 - 03/18/22  ? PT Start Time 1016   ? PT Stop Time 1058   ? PT Time Calculation (min) 42 min   ? Activity Tolerance Patient tolerated treatment well   ? Behavior During Therapy [facility] for tasks assessed/performed   ? ?  ?  ? ?  ? ? ?Past Medical History:  ?Diagnosis Date  ? Arthritis   ? Cancer [facility])   ? ? ?Past Surgical History:  ?Procedure Laterality Date  ? COLONOSCOPY    ? TOTAL HIP ARTHROPLASTY Right 02/15/2022  ? Procedure: TOTAL HIP ARTHROPLASTY ANTERIOR APPROACH;  Surgeon: Renette Butters, MD;  Location: [facility];  Service: Orthopedics;  Laterality: Right;  ? ? ?There were no vitals filed for this visit. ? ? Subjective Assessment - 03/28/22 1019   ? ? 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   ? Pertinent History R THA 02/15/22   ? Patient Stated Goals "walk comfortably w/o the walker or a cane w/o a limp"   ? Currently in Pain? No/denies   ? ?  ?  ? ?  ? ? ? ? ? [facility] PT Assessment - 03/28/22 0001   ? ?  ? Assessment  ? Medical Diagnosis R THA - anterior approach   ? Referring Provider (PT) Edmonia Lynch, MD   ? Onset Date/Surgical Date 02/15/22   ? Next MD Visit 03/28/22   ?  ? Strength  ? Right Hip Flexion 4/5   ? Right Hip Extension 4/5   ? Right Hip External Rotation  4+/5   ? Right Hip Internal Rotation 4+/5   ? Right Hip ABduction 4+/5   ? Right Hip ADduction 4+/5    ? Left Hip Flexion 4+/5   ? Left Hip Extension 4+/5   ? Left Hip External Rotation 4+/5   ? Left Hip Internal Rotation 4+/5   ? Left Hip ABduction 4+/5   ? Left Hip ADduction 4+/5   ? Right Knee Flexion 4+/5   ? Right Knee Extension 4/5   ? Left Knee Flexion 4+/5   ? Left Knee Extension 4+/5   ? ?  ?  ? ?  ? ? ? ? ? ? ? ? ? ? ? ? ? ? ? ? [facility] Adult PT Treatment/Exercise - 03/28/22 0001   ? ?  ? Knee/Hip Exercises: Aerobic  ? Nustep L6 x 6 min  (UE/LE)   ?  ? Knee/Hip Exercises: Standing  ? Hip Flexion Stengthening;Both;20 reps;Knee straight   ? Hip Flexion Limitations red TB at ankles; UE support on RW   ? Forward Lunges Right;1 set;10 reps;3 seconds   ? Forward Lunges Limitations LLE on slider, small ROM   ? Hip Extension Stengthening;Both;20 reps;Knee straight   ? Extension Limitations red TB at ankles; UE support   ?  Lateral Step Up Right;20 reps;Hand Hold: 0;Step Height: 6"   ? Forward Step Up Right;20 reps;Hand Hold: 0;Step Height: 6"   ? ?  ?  ? ?  ? ? ? ? ? ? ? ? ? ? ? ? PT Short Term Goals - 02/28/22 1025   ? ?  ? PT SHORT TERM GOAL #1  ? Title Patient will be independent in initial HEP to improve strength/mobility for better functional independence with ADLs   ? Status Achieved   02/28/22  ? Target Date 03/03/22   ? ?  ?  ? ?  ? ? ? ? PT Long Term Goals - 03/28/22 1036   ? ?  ? PT LONG TERM GOAL #1  ? Title Patient will demonstrate independent use of ongoing/advanced HEP to facilitate ability to maintain/progress functional gains from skilled physical therapy services   ? Status Partially Met   ? Target Date 05/09/22   ?  ? PT LONG TERM GOAL #2  ? Title Patient to report reduction in frequency and intensity of R hip pain by >/= 50-75% to allow for improved activity tolerance   ? Status Achieved   03/18/22- overall pt notes resolution of R hip pain  ? Target Date 03/31/22   ?  ? PT LONG TERM GOAL #3  ? Title Patient will demonstrate improved B LE strength to >/= 4 to 4+/5 for improved stability and ease of  mobility   ? Status Partially Met   03/28/22- see flowsheet  ? Target Date 05/09/22   ?  ? PT LONG TERM GOAL #4  ? Title Patient will ambulate with normal gait pattern and good gait stability w/o AD   ? Status Achieved   03/18/22  ? Target Date 03/31/22   ?  ? PT LONG TERM GOAL #5  ? Title Patient to report ability to perform ADLs, household, and leisure activities without limitation due to R hip pain, LOM or weakness   ? Status Partially Met   03/28/22- pt limited with endurance in weight bearing positions  ? Target Date 05/09/22   ?  ? PT LONG TERM GOAL #6  ? Title Patient will improve hip FOTO to >/= 71 to demonstrate improving function   ? Baseline 35 (02/17/22); 67 (03/18/22)   ? Status Partially Met   ? Target Date 05/09/22   ? ?  ?  ? ?  ? ? ? ? ? ? ? ? Plan - 03/28/22 1059   ? ? Clinical Impression Statement Pt shows improved R LE strength with abduction and ER, still limited with ext, flex, and knee ext. She reports fatigue and low endurance in the R hip  with weight bearing activities at home like cleaning. She does demonstrate more difficulty with R LE stability with step ups due to the increased WB. Pt has made progress with PT, all goals not fully achieved are partially met. Due to her doing so well pt would be able to decreased frequency to 1x a week. Due to continued weakness in the R hip and knee muscles limiting her confidence with ADLs, pt would continue to benefit from PT for an additional 1x per week for 6 weeks.   ? Personal Factors and Comorbidities Comorbidity 2   ? Comorbidities OA, back pain   ? PT Frequency 1x / week   ? PT Duration 6 weeks   ? PT Treatment/Interventions ADLs/Self Care Home Management;Cryotherapy;Electrical Stimulation;Moist Heat;Ultrasound;DME Instruction;Gait training;Stair training;Functional mobility training;Therapeutic activities;Therapeutic exercise;Balance training;Neuromuscular  re-education;Manual techniques;Scar mobilization;Passive range of motion;Dry needling;Taping    ? PT Next Visit Plan hip strengthening, functional strengthening   ? PT Home Exercise Plan hospital issued HEP;  Access Code: [email or web address]   ? Consulted and Agree with Plan of Care Patient   ? ?  ?  ? ?  ? ? ?Patient will benefit from skilled therapeutic intervention in order to improve the following deficits and impairments:  Abnormal gait, Decreased activity tolerance, Decreased balance, Decreased endurance, Decreased mobility, Decreased range of motion, Decreased strength, Difficulty walking, Increased edema, Increased fascial restricitons, Increased muscle spasms, Impaired perceived functional ability, Impaired flexibility, Improper body mechanics, Postural dysfunction, Pain ? ?Visit Diagnosis: ?Pain in right hip ? ?Stiffness of right hip, not elsewhere classified ? ?Muscle weakness (generalized) ? ?Other abnormalities of gait and mobility ? ?Difficulty in walking, not elsewhere classified ? ? ? ? ?Problem List ?Patient Active Problem List  ? Diagnosis Date Noted  ? 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 ? ?Artist Pais, PTA ?03/28/2022, 12:10 PM ? ?[place] ?[facility] ?[address] ?[place], [place], [address] ?Phone: [phone]   Fax:  [phone] ? ?Name: Kristina Vang ?MRN: [id number] ?Date of Birth: 10-Feb-1951 ? ? ?PT in agreement with plan to recert with frequency reduced to 1x/wk for up to an additional 6 weeks. ? ?Percival Spanish, PT, MPT ?03/29/22, 1:45 PM ? ?[place] ?[facility] ?[address] ?[place], [place], [address] ?Phone: [phone]   Fax:  [phone] ? ? ?

## 2022-03-29 NOTE — Addendum Note (Signed)
Addended by: Percival Spanish on: 03/29/2022 01:45 PM ? ? Modules accepted: Orders ? ?

## 2022-04-07 ENCOUNTER — Other Ambulatory Visit: Payer: Self-pay

## 2022-04-07 ENCOUNTER — Ambulatory Visit: Payer: Medicare Other | Admitting: Physical Therapy

## 2022-04-07 ENCOUNTER — Encounter: Payer: Self-pay | Admitting: Physical Therapy

## 2022-04-07 DIAGNOSIS — M25651 Stiffness of right hip, not elsewhere classified: Secondary | ICD-10-CM

## 2022-04-07 DIAGNOSIS — R2689 Other abnormalities of gait and mobility: Secondary | ICD-10-CM

## 2022-04-07 DIAGNOSIS — M25551 Pain in right hip: Secondary | ICD-10-CM

## 2022-04-07 DIAGNOSIS — R262 Difficulty in walking, not elsewhere classified: Secondary | ICD-10-CM

## 2022-04-07 DIAGNOSIS — M6281 Muscle weakness (generalized): Secondary | ICD-10-CM

## 2022-04-07 NOTE — Patient Instructions (Addendum)
Access Code: [email or web address] ?URL: [email or web address] ?Date: 04/07/2022 ?Prepared by: Almyra Free ? ?Exercises ?- Standing 3-way Hip with Walker  - 1 x daily - 7 x weekly - 2 sets - 10 reps - 3 sec hold ?- Mini Squat with Counter Support  - 1 x daily - 4-5 x weekly - 2 sets - 10 reps - 3-5 sec hold ?- Heel Toe Raises with Counter Support  - 1 x daily - 4-5 x weekly - 2 sets - 10 reps - 3 sec hold ?- Seated Hip Adduction Squeeze with Ball  - 1 x daily - 4-5 x weekly - 2 sets - 10 reps - 3-5 sec hold ?- Sit to Stand with Resistance Around Legs  - 1 x daily - 3-4 x weekly - 2 sets - 10 reps ?- Clamshell with Resistance  - 1 x daily - 3-4 x weekly - 2 sets - 10 reps - 3-5 sec hold ?- Standing Terminal Knee Extension with Resistance  - 1 x daily - 3-4 x weekly - 2 sets - 10 reps - 5 sec hold ?- Marching with Resistance  - 1 x daily - 3 x weekly - 3 sets - 10 reps ?- Standing Hip Adduction with Resistance  - 1 x daily - 3 x weekly - 3 sets - 10 reps ?

## 2022-04-07 NOTE — Therapy (Signed)
[place] ?[facility] ?[address] ?[place], [place], [address] ?Phone: [phone]   Fax:  [phone] ? ?Physical Therapy Treatment ? ?Patient Details  ?Name: Kristina Vang ?MRN: [id number] ?Date of Birth: 09-16-1951 ?Referring Provider (PT): Edmonia Lynch, MD ? ? ?Encounter Date: 04/07/2022 ? ? PT End of Session - 04/07/22 1104   ? ? Visit Number 12   ? Number of Visits 18   ? Date for PT Re-Evaluation 05/09/22   ? Authorization Type BCBS Medicare   ? Progress Note Due on Visit 18   ? PT Start Time 1104   ? PT Stop Time 4585   ? PT Time Calculation (min) 38 min   ? Activity Tolerance Patient tolerated treatment well   ? Behavior During Therapy [facility] for tasks assessed/performed   ? ?  ?  ? ?  ? ? ?Past Medical History:  ?Diagnosis Date  ? Arthritis   ? Cancer [facility])   ? ? ?Past Surgical History:  ?Procedure Laterality Date  ? COLONOSCOPY    ? TOTAL HIP ARTHROPLASTY Right 02/15/2022  ? Procedure: TOTAL HIP ARTHROPLASTY ANTERIOR APPROACH;  Surgeon: Renette Butters, MD;  Location: [facility];  Service: Orthopedics;  Laterality: Right;  ? ? ?There were no vitals filed for this visit. ? ? Subjective Assessment - 04/07/22 1104   ? ? Subjective I have been able to increase my walking distance to > .25 mile and no pain. Exercises are still very challenging.   ? Pertinent History R THA 02/15/22   ? Limitations Sitting;Standing;Walking;House hold activities   ? How long can you walk comfortably? 15 min   ? Patient Stated Goals "walk comfortably w/o the walker or a cane w/o a limp"   ? Currently in Pain? No/denies   ? ?  ?  ? ?  ? ? ? ? ? [facility] PT Assessment - 04/07/22 0001   ? ?  ? Strength  ? Right Hip Flexion 4+/5   ? Right Hip Extension 5/5   ? Right Hip External Rotation  5/5   ? Right Hip Internal Rotation 4+/5   ? Right Hip ABduction 5/5   ? Right Hip ADduction 5/5   ? Left Hip Flexion 5/5   ? Left Hip Extension 4+/5   ? Left Hip External Rotation 5/5   ? Left Hip  Internal Rotation 5/5   ? Left Hip ABduction 4+/5   ? Left Hip ADduction 4+/5   ? Right Knee Flexion 5/5   ? Right Knee Extension 5/5   ? Left Knee Flexion 5/5   ? Left Knee Extension 5/5   ? ?  ?  ? ?  ? ? ? ? ? ? ? ? ? ? ? ? ? ? ? ? [facility] Adult PT Treatment/Exercise - 04/07/22 0001   ? ?  ? Knee/Hip Exercises: Aerobic  ? Nustep L7 x 6 min  (UE/LE)   ?  ? Knee/Hip Exercises: Standing  ? Hip Flexion Stengthening;Both;20 reps;Knee straight   ? Hip Flexion Limitations GTB at ankles one hand hold counter   ? Hip ADduction 10 reps   ? Hip ADduction Limitations GTB hard R so will use red at home   ? Hip Abduction Both;20 reps;Knee straight   ? Abduction Limitations GTB   ? Hip Extension Stengthening;Both;20 reps;Knee straight   ? Extension Limitations GTB at ankles; UE support   ? Other Standing Knee Exercises B side stepping & fwd/back  monster walks with looped GTB at ankles 4 x 61f   ? Other Standing Knee Exercises Marching YTB at counter 2x10 bil; cues to engage abdominals   ? ?  ?  ? ?  ? ? ? ? ? ? ? ? ? ? PT Education - 04/07/22 1221   ? ? Education Details HEP UPDATE   ? Person(s) Educated Patient   ? Methods Explanation;Demonstration;Handout   ? Comprehension Verbalized understanding;Returned demonstration   ? ?  ?  ? ?  ? ? ? PT Short Term Goals - 02/28/22 1025   ? ?  ? PT SHORT TERM GOAL #1  ? Title Patient will be independent in initial HEP to improve strength/mobility for better functional independence with ADLs   ? Status Achieved   02/28/22  ? Target Date 03/03/22   ? ?  ?  ? ?  ? ? ? ? PT Long Term Goals - 04/07/22 1109   ? ?  ? PT LONG TERM GOAL #1  ? Title Patient will demonstrate independent use of ongoing/advanced HEP to facilitate ability to maintain/progress functional gains from skilled physical therapy services   ? Status Partially Met   ?  ? PT LONG TERM GOAL #3  ? Title Patient will demonstrate improved B LE strength to >/= 4 to 4+/5 for improved stability and ease of mobility   ? Status Achieved    ?  ? PT LONG TERM GOAL #5  ? Title Patient to report ability to perform ADLs, household, and leisure activities without limitation due to R hip pain, LOM or weakness   ? Baseline no difficulty with ADLS and household chores; still limited with walking   ? Status Partially Met   ?  ? PT LONG TERM GOAL #6  ? Title Patient will improve hip FOTO to >/= 71 to demonstrate improving function   ? Status On-going   ? ?  ?  ? ?  ? ? ? ? ? ? ? ? Plan - 04/07/22 1218   ? ? Clinical Impression Statement JDarcellepresents today reporting increased walking endurance and strength. MMT shows siginificant improvement in bil LE strength and she has met her LTG. She is still challenged with resisted hip exercises but we were able to progress resistance today. She will continue to benefit from skilled PT to address unmet goals.   ? Examination-Activity Limitations Bathing;Bed Mobility;Bend;Dressing;Hygiene/Grooming;Lift;Sit;Transfers;Stand;Locomotion Level;Stairs;Squat;Toileting   ? PT Frequency 2x / week   ? PT Duration 6 weeks   ? PT Treatment/Interventions ADLs/Self Care Home Management;Cryotherapy;Electrical Stimulation;Moist Heat;Ultrasound;DME Instruction;Gait training;Stair training;Functional mobility training;Therapeutic activities;Therapeutic exercise;Balance training;Neuromuscular re-education;Manual techniques;Scar mobilization;Passive range of motion;Dry needling;Taping   ? PT Next Visit Plan hip strengthening, functional strengthening   ? PT Home Exercise Plan hospital issued HEP;  Access Code: [email or web address]  ? Consulted and Agree with Plan of Care Patient   ? ?  ?  ? ?  ? ? ?Patient will benefit from skilled therapeutic intervention in order to improve the following deficits and impairments:  Abnormal gait, Decreased activity tolerance, Decreased balance, Decreased endurance, Decreased mobility, Decreased range of motion, Decreased strength, Difficulty walking, Increased edema, Increased fascial restricitons, Increased muscle  spasms, Impaired perceived functional ability, Impaired flexibility, Improper body mechanics, Postural dysfunction, Pain ? ?Visit Diagnosis: ?Pain in right hip ? ?Stiffness of right hip, not elsewhere classified ? ?Muscle weakness (generalized) ? ?Other abnormalities of gait and mobility ? ?Difficulty in walking, not elsewhere classified ? ? ? ? ?Problem List ?Patient Active  Problem List  ? Diagnosis Date Noted  ? 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 ?Madelyn Flavors, PT ?04/07/2022, 12:23 PM ? ?[place] ?[facility] ?[address] ?[place], [place], [address] ?Phone: [phone]   Fax:  [phone] ? ?Name: Kristina Vang ?MRN: [id number] ?Date of Birth: [birth year] ? ? ? ?

## 2022-04-13 ENCOUNTER — Ambulatory Visit: Payer: Medicare Other

## 2022-04-13 DIAGNOSIS — M25551 Pain in right hip: Secondary | ICD-10-CM

## 2022-04-13 DIAGNOSIS — M25651 Stiffness of right hip, not elsewhere classified: Secondary | ICD-10-CM

## 2022-04-13 DIAGNOSIS — R2689 Other abnormalities of gait and mobility: Secondary | ICD-10-CM

## 2022-04-13 DIAGNOSIS — M6281 Muscle weakness (generalized): Secondary | ICD-10-CM

## 2022-04-13 DIAGNOSIS — R262 Difficulty in walking, not elsewhere classified: Secondary | ICD-10-CM

## 2022-04-13 NOTE — Therapy (Signed)
[place] [facility] ?[address] ?[place], [place], [address] ?Phone: [phone]   Fax:  [phone] ? ?Physical Therapy Treatment ? ?Patient Details  ?Name: Kristina Vang ?MRN: [id number] ?Date of Birth: 22-Jul-1951 ?Referring Provider (PT): Edmonia Lynch, MD ? ? ?Encounter Date: 04/13/2022 ? ? PT End of Session - 04/13/22 1659   ? ? Visit Number 13   ? Number of Visits 18   ? Date for PT Re-Evaluation 05/09/22   ? Authorization Type BCBS Medicare   ? Progress Note Due on Visit 18   ? PT Start Time 1616   ? PT Stop Time 6948   ? PT Time Calculation (min) 42 min   ? Activity Tolerance Patient tolerated treatment well   ? Behavior During Therapy [facility] for tasks assessed/performed   ? ?  ?  ? ?  ? ? ?Past Medical History:  ?Diagnosis Date  ? Arthritis   ? Cancer [facility])   ? ? ?Past Surgical History:  ?Procedure Laterality Date  ? COLONOSCOPY    ? TOTAL HIP ARTHROPLASTY Right 02/15/2022  ? Procedure: TOTAL HIP ARTHROPLASTY ANTERIOR APPROACH;  Surgeon: Renette Butters, MD;  Location: [facility];  Service: Orthopedics;  Laterality: Right;  ? ? ?There were no vitals filed for this visit. ? ? Subjective Assessment - 04/13/22 1618   ? ? Subjective Pt reports that everything is going good, no new complaints.   ? Pertinent History R THA 02/15/22   ? Patient Stated Goals "walk comfortably w/o the walker or a cane w/o a limp"   ? Currently in Pain? No/denies   ? ?  ?  ? ?  ? ? ? ? ? ? ? ? ? ? ? ? ? ? ? ? ? ? ? ? [place] [facility] Adult PT Treatment/Exercise - 04/13/22 0001   ? ?  ? Knee/Hip Exercises: Aerobic  ? Tread Mill 1.4 mph x 4 min to cool down   ? Nustep L6 x 6 min  (UE/LE)   ?  ? Knee/Hip Exercises: Machines for Strengthening  ? Cybex Knee Extension 15lb 2 x 10 BLE, 5lb x 10 RLE   ? Cybex Knee Flexion 25lb 2x10 BLE, 10lb x 10 RLE   ? Cybex Leg Press 20# x 10   ?  ? Knee/Hip Exercises: Standing  ? Functional Squat 15 reps   ? SLS R single leg mini squat x 20   ? Other Standing Knee  Exercises B side stepping & fwd/back monster walks with looped GTB at ankles 4 x 70f   ? ?  ?  ? ?  ? ? ? ? ? ? ? ? ? ? ? ? PT Short Term Goals - 02/28/22 1025   ? ?  ? PT SHORT TERM GOAL #1  ? Title Patient will be independent in initial HEP to improve strength/mobility for better functional independence with ADLs   ? Status Achieved   02/28/22  ? Target Date 03/03/22   ? ?  ?  ? ?  ? ? ? ? PT Long Term Goals - 04/07/22 1109   ? ?  ? PT LONG TERM GOAL #1  ? Title Patient will demonstrate independent use of ongoing/advanced HEP to facilitate ability to maintain/progress functional gains from skilled physical therapy services   ? Status Partially Met   ?  ? PT LONG TERM GOAL #3  ? Title Patient will demonstrate improved B LE strength to >/= 4 to  4+/5 for improved stability and ease of mobility   ? Status Achieved   ?  ? PT LONG TERM GOAL #5  ? Title Patient to report ability to perform ADLs, household, and leisure activities without limitation due to R hip pain, LOM or weakness   ? Baseline no difficulty with ADLS and household chores; still limited with walking   ? Status Partially Met   ?  ? PT LONG TERM GOAL #6  ? Title Patient will improve hip FOTO to >/= 71 to demonstrate improving function   ? Status On-going   ? ?  ?  ? ?  ? ? ? ? ? ? ? ? Plan - 04/13/22 1659   ? ? Clinical Impression Statement Pt is coming along well, able to keep progressing exercises w/o increased hip pain but fatigue noted with the progression of exercises. She notes being able to progress walking distance but still limited walking for longer distances. Cues given prn with exercises to correct form.   ? Personal Factors and Comorbidities Comorbidity 2   ? Comorbidities OA, back pain   ? PT Frequency 2x / week   ? PT Duration 6 weeks   ? PT Treatment/Interventions ADLs/Self Care Home Management;Cryotherapy;Electrical Stimulation;Moist Heat;Ultrasound;DME Instruction;Gait training;Stair training;Functional mobility training;Therapeutic  activities;Therapeutic exercise;Balance training;Neuromuscular re-education;Manual techniques;Scar mobilization;Passive range of motion;Dry needling;Taping   ? PT Next Visit Plan hip strengthening, functional strengthening   ? PT Home Exercise Plan hospital issued HEP;  Access Code: [email or web address]   ? Consulted and Agree with Plan of Care Patient   ? ?  ?  ? ?  ? ? ?Patient will benefit from skilled therapeutic intervention in order to improve the following deficits and impairments:  Abnormal gait, Decreased activity tolerance, Decreased balance, Decreased endurance, Decreased mobility, Decreased range of motion, Decreased strength, Difficulty walking, Increased edema, Increased fascial restricitons, Increased muscle spasms, Impaired perceived functional ability, Impaired flexibility, Improper body mechanics, Postural dysfunction, Pain ? ?Visit Diagnosis: ?Pain in right hip ? ?Stiffness of right hip, not elsewhere classified ? ?Muscle weakness (generalized) ? ?Other abnormalities of gait and mobility ? ?Difficulty in walking, not elsewhere classified ? ? ? ? ?Problem List ?Patient Active Problem List  ? Diagnosis Date Noted  ? 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 ? ?Artist Pais, PTA ?04/13/2022, 6:00 PM ? ?[place] [facility] ?[address] ?[place], [place], [address] ?Phone: [phone]   Fax:  [phone] ? ?Name: Kristina Vang ?MRN: [id number] ?Date of Birth: Oct 10, 1951 ? ? ? ?

## 2022-04-20 ENCOUNTER — Ambulatory Visit: Payer: Medicare Other

## 2022-04-20 DIAGNOSIS — M25651 Stiffness of right hip, not elsewhere classified: Secondary | ICD-10-CM

## 2022-04-20 DIAGNOSIS — M25551 Pain in right hip: Secondary | ICD-10-CM | POA: Diagnosis not present

## 2022-04-20 DIAGNOSIS — R262 Difficulty in walking, not elsewhere classified: Secondary | ICD-10-CM

## 2022-04-20 DIAGNOSIS — R2689 Other abnormalities of gait and mobility: Secondary | ICD-10-CM

## 2022-04-20 DIAGNOSIS — M6281 Muscle weakness (generalized): Secondary | ICD-10-CM

## 2022-04-20 NOTE — Therapy (Signed)
[facility] [address]  [place], [place], [address] Phone: [phone]   Fax:  [phone]  Physical Therapy Treatment  Patient Details  Name: Kristina Vang MRN: [id number] Date of Birth: 04-14-1951 Referring Provider (PT): Edmonia Lynch, MD  Rationale for Evaluation and Treatment Rehabilitation  Encounter Date: 04/20/2022   PT End of Session - 04/20/22 1700     Visit Number 14    Number of Visits 18    Date for PT Re-Evaluation 05/09/22    Authorization Type BCBS Medicare    Progress Note Due on Visit 18    PT Start Time 1619    PT Stop Time 1700    PT Time Calculation (min) 41 min    Activity Tolerance Patient tolerated treatment well    Behavior During Therapy [facility] for tasks assessed/performed             Past Medical History:  Diagnosis Date   Arthritis    Cancer [facility])     Past Surgical History:  Procedure Laterality Date   COLONOSCOPY     TOTAL HIP ARTHROPLASTY Right 02/15/2022   Procedure: TOTAL HIP ARTHROPLASTY ANTERIOR APPROACH;  Surgeon: Renette Butters, MD;  Location: [facility];  Service: Orthopedics;  Laterality: Right;    There were no vitals filed for this visit.   Subjective Assessment - 04/20/22 1620     Subjective Pt reports that she is still trying to progress walking but feels limited by her overall endurance.    Pertinent History R THA 02/15/22    Patient Stated Goals "walk comfortably w/o the walker or a cane w/o a limp"    Currently in Pain? No/denies                               [facility] Adult PT Treatment/Exercise - 04/20/22 0001       Ambulation/Gait   Gait Comments gait 2x ~300 ft; fatigue      Knee/Hip Exercises: Aerobic   Nustep L7 x 6 min  (UE/LE)      Knee/Hip Exercises: Machines for Strengthening   Cybex Knee Extension 20lb x 15 BLE; 10lb x 15 R LE    Cybex Knee Flexion 25lb x 15 BLE, 10lb x 10 RLE    Cybex Leg Press --   review set up  and provided demo                      PT Short Term Goals - 02/28/22 1025       PT SHORT TERM GOAL #1   Title Patient will be independent in initial HEP to improve strength/mobility for better functional independence with ADLs    Status Achieved   02/28/22   Target Date 03/03/22               PT Long Term Goals - 04/07/22 1109       PT LONG TERM GOAL #1   Title Patient will demonstrate independent use of ongoing/advanced HEP to facilitate ability to maintain/progress functional gains from skilled physical therapy services    Status Partially Met      PT LONG TERM GOAL #3   Title Patient will demonstrate improved B LE strength to >/= 4 to 4+/5 for improved stability and ease of mobility    Status Achieved      PT LONG TERM GOAL #5   Title  Patient to report ability to perform ADLs, household, and leisure activities without limitation due to R hip pain, LOM or weakness    Baseline no difficulty with ADLS and household chores; still limited with walking    Status Partially Met      PT LONG TERM GOAL #6   Title Patient will improve hip FOTO to >/= 71 to demonstrate improving function    Status On-going                   Plan - 04/20/22 1701     Clinical Impression Statement Progressed gait training with patient today to improve walking distance and tolerance as this is her biggest functional limitation now. By the end of gait training she showed fatigue in the R LE. Reviewed gym equipment and continued working on machines to allow for smooth return to [facility] based gym routine safely. Pt has been doing well, she is well of to wrap up with PT next visit as she is progressing well toward goals.    Personal Factors and Comorbidities Comorbidity 2    Comorbidities OA, back pain    PT Frequency 2x / week    PT Duration 6 weeks    PT Treatment/Interventions ADLs/Self Care Home Management;Cryotherapy;Electrical Stimulation;Moist Heat;Ultrasound;DME  Instruction;Gait training;Stair training;Functional mobility training;Therapeutic activities;Therapeutic exercise;Balance training;Neuromuscular re-education;Manual techniques;Scar mobilization;Passive range of motion;Dry needling;Taping    PT Next Visit Plan hip strengthening, functional strengthening    PT Home Exercise Plan hospital issued HEP;  Access Code: [email or web address]    Consulted and Agree with Plan of Care Patient             Patient will benefit from skilled therapeutic intervention in order to improve the following deficits and impairments:  Abnormal gait, Decreased activity tolerance, Decreased balance, Decreased endurance, Decreased mobility, Decreased range of motion, Decreased strength, Difficulty walking, Increased edema, Increased fascial restricitons, Increased muscle spasms, Impaired perceived functional ability, Impaired flexibility, Improper body mechanics, Postural dysfunction, Pain  Visit Diagnosis: Pain in right hip  Stiffness of right hip, not elsewhere classified  Muscle weakness (generalized)  Other abnormalities of gait and mobility  Difficulty in walking, not elsewhere classified     Problem List Patient Active Problem List   Diagnosis Date Noted   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

## 2022-04-27 ENCOUNTER — Ambulatory Visit: Payer: Medicare Other

## 2022-04-27 DIAGNOSIS — M25551 Pain in right hip: Secondary | ICD-10-CM | POA: Diagnosis not present

## 2022-04-27 DIAGNOSIS — R262 Difficulty in walking, not elsewhere classified: Secondary | ICD-10-CM

## 2022-04-27 DIAGNOSIS — M6281 Muscle weakness (generalized): Secondary | ICD-10-CM

## 2022-04-27 DIAGNOSIS — R2689 Other abnormalities of gait and mobility: Secondary | ICD-10-CM

## 2022-04-27 DIAGNOSIS — M25651 Stiffness of right hip, not elsewhere classified: Secondary | ICD-10-CM

## 2022-04-27 NOTE — Therapy (Addendum)
[facility] [address]  [place], [place], [address] Phone: [phone]   Fax:  [phone]  Physical Therapy Treatment / Discharge Summary  Patient Details  Name: Kristina Vang MRN: [id number] Date of Birth: 1951/07/02 Referring Provider (PT): Edmonia Lynch, MD   Encounter Date: 04/27/2022   PT End of Session - 04/27/22 1656     Visit Number 15    Number of Visits 18    Date for PT Re-Evaluation 05/09/22    Authorization Type BCBS Medicare    Progress Note Due on Visit 18    PT Start Time 1618    PT Stop Time 1653    PT Time Calculation (min) 35 min    Activity Tolerance Patient tolerated treatment well    Behavior During Therapy [facility] for tasks assessed/performed             Past Medical History:  Diagnosis Date   Arthritis    Cancer [facility])     Past Surgical History:  Procedure Laterality Date   COLONOSCOPY     TOTAL HIP ARTHROPLASTY Right 02/15/2022   Procedure: TOTAL HIP ARTHROPLASTY ANTERIOR APPROACH;  Surgeon: Renette Butters, MD;  Location: [facility];  Service: Orthopedics;  Laterality: Right;    There were no vitals filed for this visit.   Subjective Assessment - 04/27/22 1619     Subjective Pt reports that she is doing well with no pain.    Pertinent History R THA 02/15/22    Patient Stated Goals "walk comfortably w/o the walker or a cane w/o a limp"    Currently in Pain? No/denies                [facility] PT Assessment - 04/27/22 0001       Assessment   Medical Diagnosis R THA - anterior approach    Referring Provider (PT) Edmonia Lynch, MD    Onset Date/Surgical Date 02/15/22      Observation/Other Assessments   Focus on Therapeutic Outcomes (FOTO)  Hip: 70, predicted D/C FS=71      Strength   Right Hip Flexion 5/5    Right Hip Extension 5/5    Right Hip External Rotation  5/5    Right Hip Internal Rotation 4+/5    Right Hip ABduction 5/5    Right Hip ADduction 5/5     Left Hip Flexion 5/5    Left Hip Extension 4+/5    Left Hip External Rotation 5/5    Left Hip Internal Rotation 5/5    Left Hip ABduction 4+/5    Left Hip ADduction 4+/5                           [facility] Adult PT Treatment/Exercise - 04/27/22 0001       Knee/Hip Exercises: Aerobic   Nustep L7 x 6 min  (UE/LE)      Knee/Hip Exercises: Machines for Strengthening   Cybex Leg Press 20# x 10                     PT Education - 04/27/22 1656     Education Details HEP update; review of gym equipment giving instruction on form and setup    Person(s) Educated Patient    Methods Explanation;Demonstration;Handout    Comprehension Verbalized understanding;Returned demonstration              PT Short Term Goals -  02/28/22 1025       PT SHORT TERM GOAL #1   Title Patient will be independent in initial HEP to improve strength/mobility for better functional independence with ADLs    Status Achieved   02/28/22   Target Date 03/03/22               PT Long Term Goals - 04/27/22 1628       PT LONG TERM GOAL #1   Title Patient will demonstrate independent use of ongoing/advanced HEP to facilitate ability to maintain/progress functional gains from skilled physical therapy services    Status Achieved   04/27/22   Target Date 05/09/22      PT LONG TERM GOAL #2   Title Patient to report reduction in frequency and intensity of R hip pain by >/= 50-75% to allow for improved activity tolerance    Status Achieved   03/18/22- overall pt notes resolution of R hip pain   Target Date 03/31/22      PT LONG TERM GOAL #3   Title Patient will demonstrate improved B LE strength to >/= 4 to 4+/5 for improved stability and ease of mobility    Status Achieved   04/27/22- see flowsheet   Target Date 05/09/22      PT LONG TERM GOAL #4   Title Patient will ambulate with normal gait pattern and good gait stability w/o AD    Status Achieved   03/18/22   Target Date 03/31/22       PT LONG TERM GOAL #5   Title Patient to report ability to perform ADLs, household, and leisure activities without limitation due to R hip pain, LOM or weakness    Status Partially Met   04/27/22- pt only notes some hip fatigue after walking for prolonged period   Target Date 05/09/22      PT LONG TERM GOAL #6   Title Patient will improve hip FOTO to >/= 71 to demonstrate improving function    Baseline 35 (02/17/22); 67 (03/18/22)    Status Partially Met   5/31- 70%   Target Date 05/09/22                   Plan - 04/27/22 1657     Clinical Impression Statement Pt has met LTG #3 today with LE strength of at least 4-4+/5 in all muscle groups. She notes no limitations with household chores but reports that after walking for long period of time she gets fatigued in her R hip. FOTO score has improved to 70%, just 1 point shy of predicted DC value, so LTG #6 is partially met. Reviewed HEP, updated exercises to target areas that needed more attention and reviewed leg press machine for proper setup for safety at gym. She denies any concerns with ongoing HEP and is I with exercises. Pt has made great progress she will be D/C as she is pleased with her functional status, although all goals aren't fully met.    Personal Factors and Comorbidities Comorbidity 2    Comorbidities OA, back pain    PT Frequency 2x / week    PT Duration 6 weeks    PT Treatment/Interventions ADLs/Self Care Home Management;Cryotherapy;Electrical Stimulation;Moist Heat;Ultrasound;DME Instruction;Gait training;Stair training;Functional mobility training;Therapeutic activities;Therapeutic exercise;Balance training;Neuromuscular re-education;Manual techniques;Scar mobilization;Passive range of motion;Dry needling;Taping    PT Next Visit Plan D/C    PT Home Exercise Plan hospital issued HEP;  Access Code: [email or web address]    Consulted and Agree with Plan of Care Patient  Patient will benefit from skilled  therapeutic intervention in order to improve the following deficits and impairments:  Abnormal gait, Decreased activity tolerance, Decreased balance, Decreased endurance, Decreased mobility, Decreased range of motion, Decreased strength, Difficulty walking, Increased edema, Increased fascial restricitons, Increased muscle spasms, Impaired perceived functional ability, Impaired flexibility, Improper body mechanics, Postural dysfunction, Pain  Visit Diagnosis: Pain in right hip  Stiffness of right hip, not elsewhere classified  Muscle weakness (generalized)  Other abnormalities of gait and mobility  Difficulty in walking, not elsewhere classified     Problem List Patient Active Problem List   Diagnosis Date Noted   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 Hamda is pleased with her progress and feels that she can now manage on her own with the HEP, therefore will proceed with discharge from PT for this episode.   Remaining deficits:   As above.   Education / Equipment:   HEP   Patient agrees to discharge. Patient goals were partially met. Patient is being discharged due to being pleased with the current functional level.  Percival Spanish, PT, MPT 05/02/22, 7:28 PM  [facility] [address]  [place], [place], [address] Phone: [phone]   Fax:  [phone]

## 2022-04-27 NOTE — Patient Instructions (Signed)
Access Code: [email or web address] URL: [email or web address] Date: 04/27/2022 Prepared by: Clarene Essex  Exercises - Standing 3-way Hip with Gilford Rile  - 1 x daily - 7 x weekly - 2 sets - 10 reps - 3 sec hold - Single Leg Squat with Chair Touch  - 1 x daily - 3 x weekly - 2 sets - 10 reps - Sit to Stand with Resistance Around Legs  - 1 x daily - 3-4 x weekly - 2 sets - 10 reps - Clamshell with Resistance  - 1 x daily - 3-4 x weekly - 2 sets - 10 reps - 3-5 sec hold - Standing Terminal Knee Extension with Resistance  - 1 x daily - 3-4 x weekly - 2 sets - 10 reps - 5 sec hold - Marching with Resistance  - 1 x daily - 3 x weekly - 3 sets - 10 reps - Standing Hip Adduction with Resistance  - 1 x daily - 3 x weekly - 3 sets - 10 reps

## 2022-07-11 ENCOUNTER — Other Ambulatory Visit: Payer: Self-pay | Admitting: Obstetrics and Gynecology

## 2022-07-11 DIAGNOSIS — Z1231 Encounter for screening mammogram for malignant neoplasm of breast: Secondary | ICD-10-CM

## 2022-08-22 ENCOUNTER — Ambulatory Visit
Admission: RE | Admit: 2022-08-22 | Discharge: 2022-08-22 | Disposition: A | Discharge status: Home / Self Care | Payer: Medicare Other | Source: Ambulatory Visit | Attending: Obstetrics and Gynecology | Admitting: Obstetrics and Gynecology

## 2022-08-22 DIAGNOSIS — Z1231 Encounter for screening mammogram for malignant neoplasm of breast: Secondary | ICD-10-CM

## 2023-01-02 IMAGING — RF DG HIP (WITH OR WITHOUT PELVIS) 1V*R*
1 series · 2 of 2 positions shown · non-contrast
Comparison: None.

CLINICAL DATA: Right hip replacement.  Surgical fluoroscopy.

EXAM:
DG HIP (WITH OR WITHOUT PELVIS) 1V RIGHT

[Series 1: unknown protocol · 0.20mm/px · 2 of 2 slices shown]
[im 1/2]
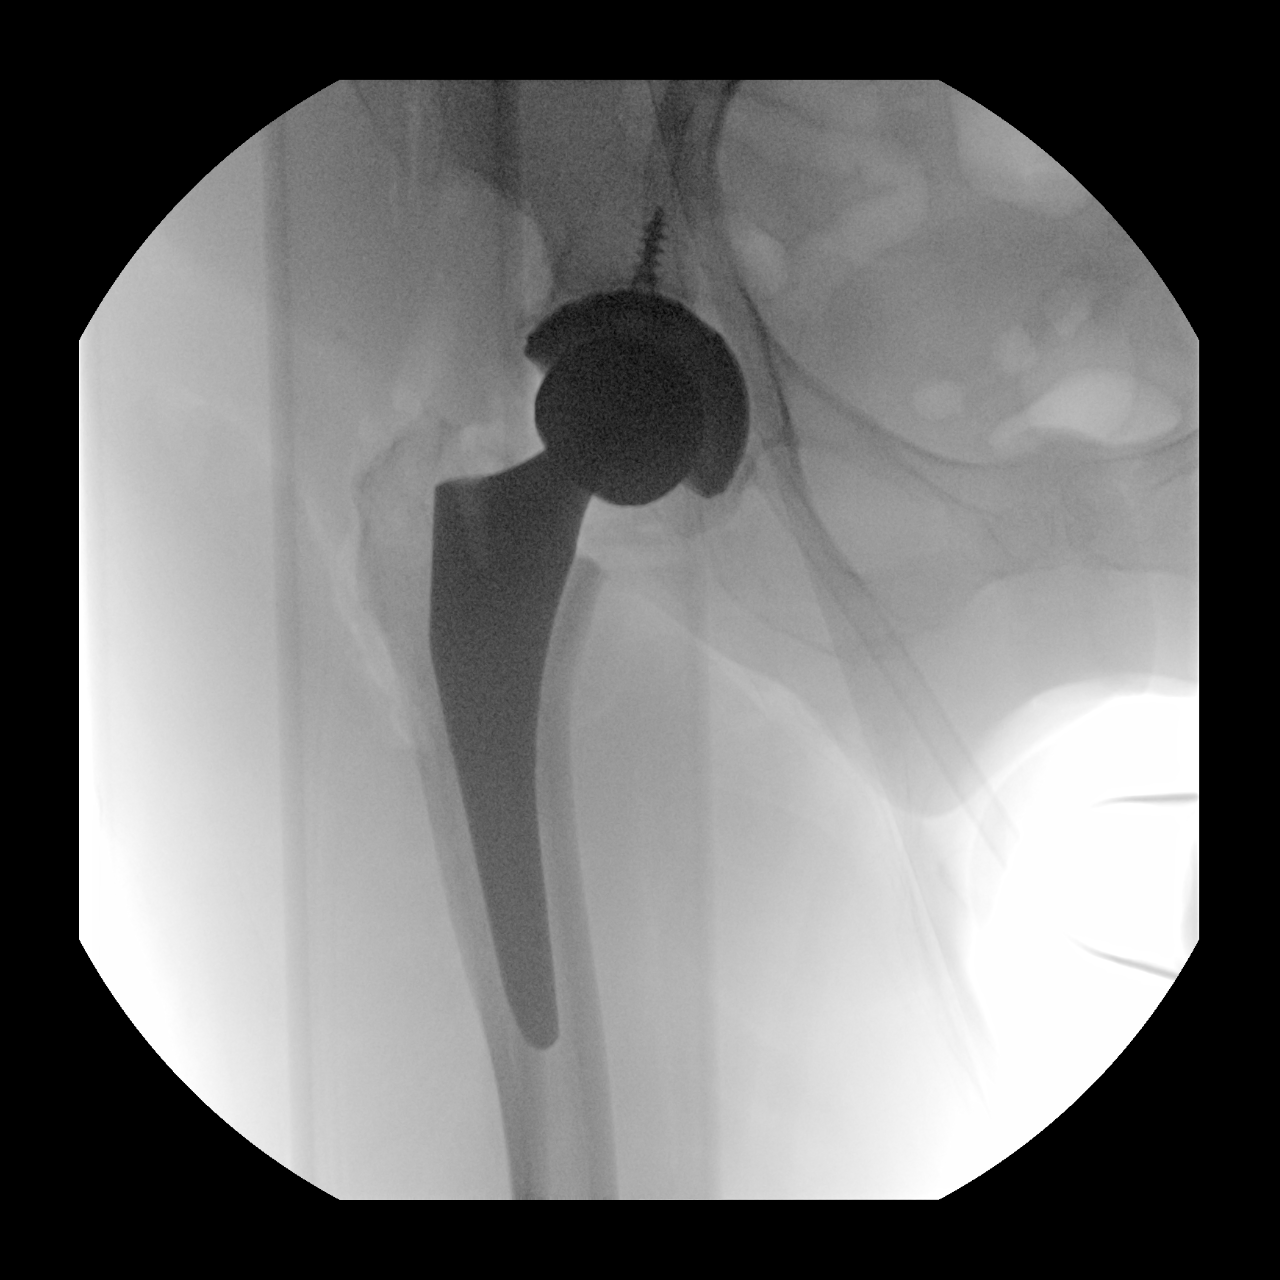
[im 2/2]
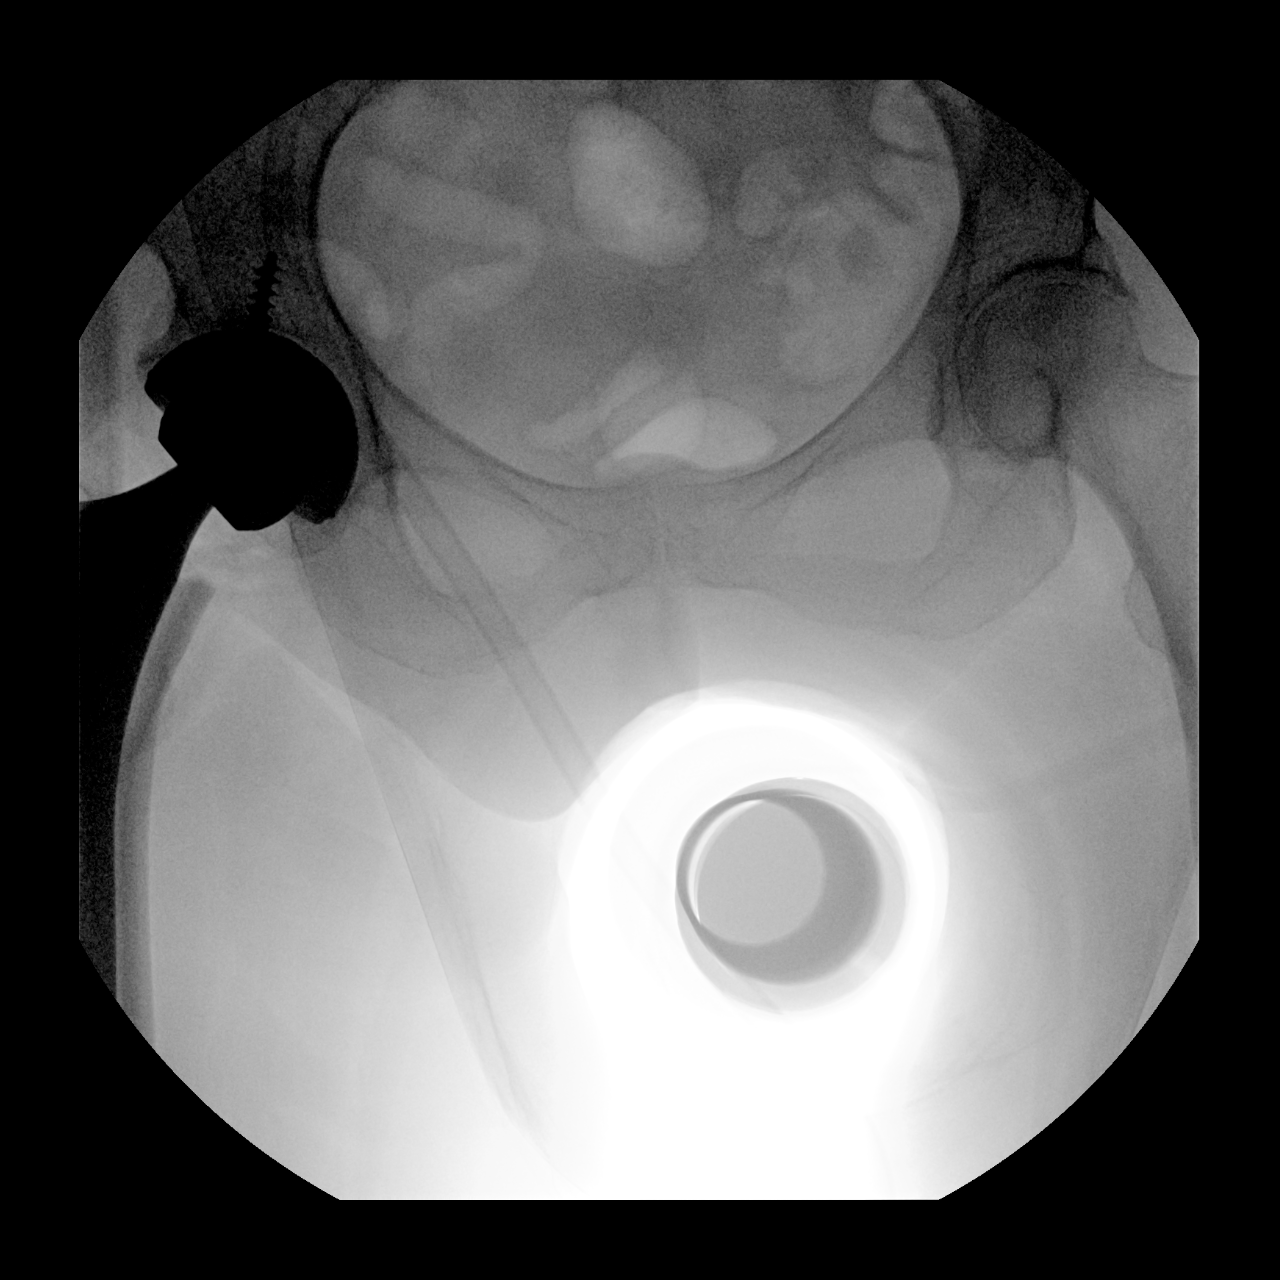

[2 of 2 positions shown; findings below may reference images not displayed]

FINDINGS: Images were performed intraoperatively without the presence of a
radiologist. Postsurgical changes are seen of total right hip
arthroplasty.

Total fluoroscopy images: 2

Total fluoroscopy time: 11 seconds

Total dose: 2.0 mGy mGy

Please see intraoperative findings for further detail.
IMPRESSION: Fluoroscopy for total right hip arthroplasty.

## 2023-08-02 ENCOUNTER — Other Ambulatory Visit: Payer: Self-pay | Admitting: Obstetrics and Gynecology

## 2023-08-02 DIAGNOSIS — Z1231 Encounter for screening mammogram for malignant neoplasm of breast: Secondary | ICD-10-CM

## 2023-08-24 ENCOUNTER — Ambulatory Visit: Payer: Medicare Other

## 2023-09-06 ENCOUNTER — Ambulatory Visit
Admission: RE | Admit: 2023-09-06 | Discharge: 2023-09-06 | Disposition: A | Discharge status: Home / Self Care | Payer: Medicare Other | Source: Ambulatory Visit | Attending: Obstetrics and Gynecology | Admitting: Obstetrics and Gynecology

## 2023-09-06 DIAGNOSIS — Z1231 Encounter for screening mammogram for malignant neoplasm of breast: Secondary | ICD-10-CM

## 2024-08-12 ENCOUNTER — Other Ambulatory Visit: Payer: Self-pay | Admitting: Obstetrics and Gynecology

## 2024-08-12 DIAGNOSIS — Z1231 Encounter for screening mammogram for malignant neoplasm of breast: Secondary | ICD-10-CM

## 2024-09-06 ENCOUNTER — Ambulatory Visit
Admission: RE | Admit: 2024-09-06 | Discharge: 2024-09-06 | Disposition: A | Discharge status: Home / Self Care | Source: Ambulatory Visit | Attending: Obstetrics and Gynecology | Admitting: Obstetrics and Gynecology

## 2024-09-06 DIAGNOSIS — Z1231 Encounter for screening mammogram for malignant neoplasm of breast: Secondary | ICD-10-CM
